# Patient Record
Sex: Female | Born: 1965 | Race: White | Hispanic: No | Smoking: Current every day smoker
Health system: Southern US, Community
[De-identification: ages and names within clinical notes are randomized; demographics above are authoritative.]

## PROBLEM LIST (undated history)

## (undated) DIAGNOSIS — H9191 Unspecified hearing loss, right ear: Secondary | ICD-10-CM

## (undated) DIAGNOSIS — F32A Depression, unspecified: Secondary | ICD-10-CM

## (undated) DIAGNOSIS — F319 Bipolar disorder, unspecified: Secondary | ICD-10-CM

## (undated) DIAGNOSIS — G43909 Migraine, unspecified, not intractable, without status migrainosus: Secondary | ICD-10-CM

## (undated) DIAGNOSIS — I509 Heart failure, unspecified: Secondary | ICD-10-CM

## (undated) DIAGNOSIS — F329 Major depressive disorder, single episode, unspecified: Secondary | ICD-10-CM

## (undated) DIAGNOSIS — I10 Essential (primary) hypertension: Secondary | ICD-10-CM

## (undated) HISTORY — DX: Unspecified hearing loss, right ear: H91.91

## (undated) HISTORY — PX: SPLENECTOMY, PARTIAL: SHX787

## (undated) HISTORY — DX: Major depressive disorder, single episode, unspecified: F32.9

## (undated) HISTORY — DX: Migraine, unspecified, not intractable, without status migrainosus: G43.909

## (undated) HISTORY — DX: Depression, unspecified: F32.A

## (undated) HISTORY — PX: APPENDECTOMY: SHX54

---

## 1997-10-18 ENCOUNTER — Emergency Department (HOSPITAL_COMMUNITY): Admission: EM | Admit: 1997-10-18 | Discharge: 1997-10-18 | Payer: Self-pay | Admitting: Emergency Medicine

## 1998-03-08 ENCOUNTER — Other Ambulatory Visit: Admission: RE | Admit: 1998-03-08 | Discharge: 1998-03-08 | Payer: Self-pay | Admitting: Obstetrics and Gynecology

## 1998-04-08 ENCOUNTER — Ambulatory Visit (HOSPITAL_COMMUNITY): Admission: RE | Admit: 1998-04-08 | Discharge: 1998-04-08 | Payer: Self-pay | Admitting: Obstetrics and Gynecology

## 1998-04-19 ENCOUNTER — Other Ambulatory Visit: Admission: RE | Admit: 1998-04-19 | Discharge: 1998-04-19 | Payer: Self-pay | Admitting: Obstetrics and Gynecology

## 1998-04-24 ENCOUNTER — Ambulatory Visit (HOSPITAL_COMMUNITY): Admission: RE | Admit: 1998-04-24 | Discharge: 1998-04-24 | Payer: Self-pay | Admitting: Obstetrics and Gynecology

## 1998-04-24 ENCOUNTER — Encounter: Payer: Self-pay | Admitting: Obstetrics and Gynecology

## 1998-07-17 ENCOUNTER — Ambulatory Visit (HOSPITAL_COMMUNITY): Admission: RE | Admit: 1998-07-17 | Discharge: 1998-07-17 | Payer: Self-pay | Admitting: Obstetrics and Gynecology

## 1998-07-21 ENCOUNTER — Encounter (HOSPITAL_COMMUNITY): Admission: RE | Admit: 1998-07-21 | Discharge: 1998-09-07 | Payer: Self-pay | Admitting: Obstetrics and Gynecology

## 1998-08-08 ENCOUNTER — Encounter: Admission: RE | Admit: 1998-08-08 | Discharge: 1998-11-06 | Payer: Self-pay | Admitting: Obstetrics and Gynecology

## 1998-09-04 ENCOUNTER — Inpatient Hospital Stay (HOSPITAL_COMMUNITY): Admission: AD | Admit: 1998-09-04 | Discharge: 1998-09-04 | Payer: Self-pay | Admitting: *Deleted

## 1998-09-06 ENCOUNTER — Inpatient Hospital Stay (HOSPITAL_COMMUNITY): Admission: AD | Admit: 1998-09-06 | Discharge: 1998-09-09 | Payer: Self-pay | Admitting: Obstetrics and Gynecology

## 1998-09-13 ENCOUNTER — Encounter: Payer: Self-pay | Admitting: Obstetrics and Gynecology

## 1998-09-13 ENCOUNTER — Ambulatory Visit (HOSPITAL_COMMUNITY): Admission: RE | Admit: 1998-09-13 | Discharge: 1998-09-13 | Payer: Self-pay | Admitting: Obstetrics and Gynecology

## 1998-10-13 ENCOUNTER — Inpatient Hospital Stay (HOSPITAL_COMMUNITY): Admission: EM | Admit: 1998-10-13 | Discharge: 1998-10-17 | Payer: Self-pay | Admitting: Emergency Medicine

## 1998-10-13 ENCOUNTER — Encounter: Payer: Self-pay | Admitting: Emergency Medicine

## 1998-10-16 ENCOUNTER — Encounter: Payer: Self-pay | Admitting: Internal Medicine

## 1998-10-20 ENCOUNTER — Encounter: Admission: RE | Admit: 1998-10-20 | Discharge: 1998-10-20 | Payer: Self-pay | Admitting: Internal Medicine

## 1998-10-27 ENCOUNTER — Encounter: Admission: RE | Admit: 1998-10-27 | Discharge: 1998-10-27 | Payer: Self-pay | Admitting: Internal Medicine

## 1998-11-03 ENCOUNTER — Encounter: Admission: RE | Admit: 1998-11-03 | Discharge: 1998-11-03 | Payer: Self-pay | Admitting: Internal Medicine

## 1998-12-15 ENCOUNTER — Encounter: Admission: RE | Admit: 1998-12-15 | Discharge: 1998-12-15 | Payer: Self-pay | Admitting: Internal Medicine

## 1999-02-20 ENCOUNTER — Emergency Department (HOSPITAL_COMMUNITY): Admission: EM | Admit: 1999-02-20 | Discharge: 1999-02-20 | Payer: Self-pay | Admitting: Emergency Medicine

## 1999-02-20 ENCOUNTER — Encounter: Payer: Self-pay | Admitting: Family Medicine

## 1999-08-30 ENCOUNTER — Emergency Department (HOSPITAL_COMMUNITY): Admission: EM | Admit: 1999-08-30 | Discharge: 1999-08-31 | Payer: Self-pay | Admitting: Emergency Medicine

## 1999-08-31 ENCOUNTER — Encounter: Payer: Self-pay | Admitting: Emergency Medicine

## 1999-08-31 ENCOUNTER — Ambulatory Visit (HOSPITAL_COMMUNITY): Admission: RE | Admit: 1999-08-31 | Discharge: 1999-08-31 | Payer: Self-pay | Admitting: Emergency Medicine

## 2000-02-06 ENCOUNTER — Emergency Department (HOSPITAL_COMMUNITY): Admission: EM | Admit: 2000-02-06 | Discharge: 2000-02-07 | Payer: Self-pay | Admitting: Emergency Medicine

## 2000-02-07 ENCOUNTER — Encounter: Payer: Self-pay | Admitting: Emergency Medicine

## 2000-07-19 ENCOUNTER — Encounter: Payer: Self-pay | Admitting: Emergency Medicine

## 2000-07-19 ENCOUNTER — Emergency Department (HOSPITAL_COMMUNITY): Admission: EM | Admit: 2000-07-19 | Discharge: 2000-07-19 | Payer: Self-pay | Admitting: Emergency Medicine

## 2000-07-24 ENCOUNTER — Encounter: Payer: Self-pay | Admitting: Emergency Medicine

## 2000-07-24 ENCOUNTER — Ambulatory Visit (HOSPITAL_COMMUNITY): Admission: RE | Admit: 2000-07-24 | Discharge: 2000-07-24 | Payer: Self-pay | Admitting: Emergency Medicine

## 2001-04-12 ENCOUNTER — Emergency Department (HOSPITAL_COMMUNITY): Admission: EM | Admit: 2001-04-12 | Discharge: 2001-04-12 | Payer: Self-pay | Admitting: Emergency Medicine

## 2001-07-04 ENCOUNTER — Emergency Department (HOSPITAL_COMMUNITY): Admission: EM | Admit: 2001-07-04 | Discharge: 2001-07-04 | Payer: Self-pay | Admitting: Emergency Medicine

## 2001-12-27 ENCOUNTER — Emergency Department (HOSPITAL_COMMUNITY): Admission: EM | Admit: 2001-12-27 | Discharge: 2001-12-28 | Payer: Self-pay | Admitting: Emergency Medicine

## 2001-12-28 ENCOUNTER — Encounter: Payer: Self-pay | Admitting: Emergency Medicine

## 2003-02-02 ENCOUNTER — Other Ambulatory Visit: Admission: RE | Admit: 2003-02-02 | Discharge: 2003-02-02 | Payer: Self-pay | Admitting: Surgery

## 2005-06-10 ENCOUNTER — Emergency Department (HOSPITAL_COMMUNITY): Admission: EM | Admit: 2005-06-10 | Discharge: 2005-06-10 | Payer: Self-pay | Admitting: Family Medicine

## 2006-06-04 ENCOUNTER — Emergency Department (HOSPITAL_COMMUNITY): Admission: EM | Admit: 2006-06-04 | Discharge: 2006-06-04 | Payer: Self-pay | Admitting: Emergency Medicine

## 2006-07-15 ENCOUNTER — Emergency Department (HOSPITAL_COMMUNITY): Admission: EM | Admit: 2006-07-15 | Discharge: 2006-07-15 | Payer: Self-pay | Admitting: Emergency Medicine

## 2006-07-15 ENCOUNTER — Emergency Department (HOSPITAL_COMMUNITY): Admission: EM | Admit: 2006-07-15 | Discharge: 2006-07-15 | Payer: Self-pay | Admitting: Family Medicine

## 2006-12-06 ENCOUNTER — Emergency Department (HOSPITAL_COMMUNITY): Admission: EM | Admit: 2006-12-06 | Discharge: 2006-12-06 | Payer: Self-pay | Admitting: Emergency Medicine

## 2006-12-27 ENCOUNTER — Emergency Department (HOSPITAL_COMMUNITY): Admission: EM | Admit: 2006-12-27 | Discharge: 2006-12-27 | Payer: Self-pay | Admitting: Emergency Medicine

## 2007-01-11 ENCOUNTER — Emergency Department (HOSPITAL_COMMUNITY): Admission: EM | Admit: 2007-01-11 | Discharge: 2007-01-11 | Payer: Self-pay | Admitting: Emergency Medicine

## 2007-01-28 ENCOUNTER — Emergency Department (HOSPITAL_COMMUNITY): Admission: EM | Admit: 2007-01-28 | Discharge: 2007-01-28 | Payer: Self-pay | Admitting: Emergency Medicine

## 2007-03-12 ENCOUNTER — Emergency Department (HOSPITAL_COMMUNITY): Admission: EM | Admit: 2007-03-12 | Discharge: 2007-03-12 | Payer: Self-pay | Admitting: Emergency Medicine

## 2007-06-01 ENCOUNTER — Emergency Department (HOSPITAL_COMMUNITY): Admission: EM | Admit: 2007-06-01 | Discharge: 2007-06-01 | Payer: Self-pay | Admitting: Family Medicine

## 2008-03-07 ENCOUNTER — Ambulatory Visit: Payer: Self-pay | Admitting: Gastroenterology

## 2008-03-07 ENCOUNTER — Ambulatory Visit: Payer: Self-pay | Admitting: Pulmonary Disease

## 2008-03-07 ENCOUNTER — Inpatient Hospital Stay (HOSPITAL_COMMUNITY): Admission: EM | Admit: 2008-03-07 | Discharge: 2008-03-15 | Payer: Self-pay | Admitting: Emergency Medicine

## 2008-04-17 ENCOUNTER — Emergency Department (HOSPITAL_COMMUNITY): Admission: EM | Admit: 2008-04-17 | Discharge: 2008-04-17 | Payer: Self-pay | Admitting: Emergency Medicine

## 2009-01-25 ENCOUNTER — Encounter (INDEPENDENT_AMBULATORY_CARE_PROVIDER_SITE_OTHER): Payer: Self-pay | Admitting: General Surgery

## 2009-01-26 ENCOUNTER — Observation Stay (HOSPITAL_COMMUNITY): Admission: EM | Admit: 2009-01-26 | Discharge: 2009-01-26 | Payer: Self-pay | Admitting: Emergency Medicine

## 2009-04-19 ENCOUNTER — Emergency Department (HOSPITAL_COMMUNITY): Admission: EM | Admit: 2009-04-19 | Discharge: 2009-04-19 | Payer: Self-pay | Admitting: Emergency Medicine

## 2009-07-20 ENCOUNTER — Emergency Department (HOSPITAL_COMMUNITY): Admission: EM | Admit: 2009-07-20 | Discharge: 2009-07-20 | Payer: Self-pay | Admitting: Emergency Medicine

## 2009-10-25 ENCOUNTER — Emergency Department (HOSPITAL_COMMUNITY): Admission: EM | Admit: 2009-10-25 | Discharge: 2009-10-25 | Payer: Self-pay | Admitting: Emergency Medicine

## 2009-11-15 ENCOUNTER — Emergency Department (HOSPITAL_COMMUNITY): Admission: EM | Admit: 2009-11-15 | Discharge: 2009-11-15 | Payer: Self-pay | Admitting: Emergency Medicine

## 2010-02-13 IMAGING — US IR TRANSCATH EMBOLIZATION
1 series · 1 of 1 positions shown · non-contrast
Comparison: none

CLINICAL DATA: Spontaneous splenic rupture with splenic hematoma
and pneumoperitoneum. The patient has been treated with volume
resuscitation and blood transfusion.  Request has been made to
study the splenic artery angiographically and embolize the splenic
artery.  This has been determined to be a better route of initial
treatment rather than immediate splenectomy due to the patient's
condition.

[Series 1: ir transcath embolization · 1 of 1 slices shown]
[im 1/1]
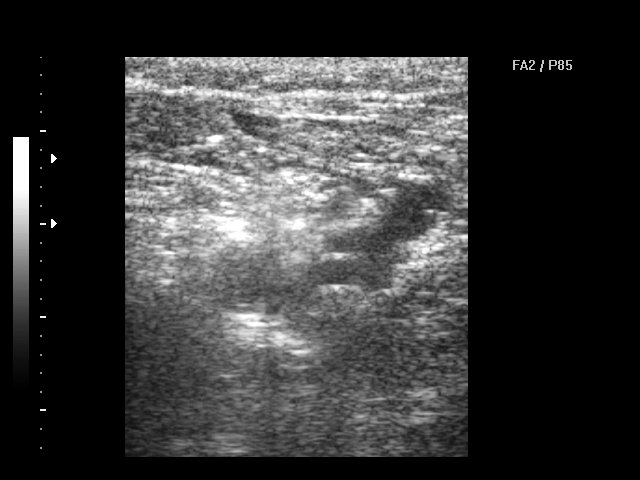

[1 of 1 positions shown; findings below may reference images not displayed]

1.  ULTRASOUND GUIDANCE FOR VASCULAR ACCESS OF THE RIGHT COMMON
FEMORAL ARTERY.
2.  VISCERAL ARTERIOGRAPHY OF THE CELIAC AXIS AND SPLENIC ARTERY.
3.  TRANSCATHETER EMBOLIZATION OF SPLENIC ARTERY.
4.  FOLLOW-UP ANGIOGRAPHY AFTER THE SPLENIC EMBOLIZATION

Sedation: 3.0 mg IV Versed; 150 mcg IV Fentanyl.

Total Moderate Sedation Time: 30 minutes.

Contrast Volume: 50 ml

Fluoroscopy Time: 5.5 minutes.

Procedure: The procedure, risks, benefits, and alternatives were
explained to the patient. Questions regarding the procedure were
encouraged and answered. The patient understands and consents to
the procedure.

The right groin was prepped with betadine in a sterile fashion, and
a sterile drape was applied covering the operative field. A sterile
gown and sterile gloves were used for the procedure. Local
anesthesia was provided with 1% Lidocaine.

The right common femoral artery was accessed under direct
ultrasound guidance with a 21 gauge needle.  Ultrasound image
documentation was performed.  After securing guide wire access, a 5-
French vascular sheath was placed.

A 5-French Cobra catheter was introduced into the abdominal aorta.
The celiac axis was catheterized and arteriography performed.  The
catheter was then further advanced over a guide wire into the
splenic artery.  Splenic arteriography was then performed.

Transcatheter embolization was then performed of the splenic artery
utilizing embolization coils.  A total of six coils were deployed
of 5 mm and 8 mm diameter.  Two 5 mm coils were initially placed
followed by a single 8 mm coil, two additional 5 mm coils and a
second 8 mm coil.

Contrast injection was performed during coil embolization.
Completion follow-up angiography was then performed through the
catheter.

After the procedure catheter and sheath were removed and hemostasis
obtained with manual compression and use of a V-pad.

Complications: None
FINDINGS: Celiac arteriography demonstrates a very tortuous splenic
artery.  Common hepatic artery is unremarkable.  Based on lack of
visualization of a right hepatic artery, the right hepatic artery
is likely replaced off of the superior mesenteric artery.

Splenic arteriography demonstrates perfusion of the entire spleen.
No active arterial contrast extravasation was visualized.  There is
no evidence of underlying splenic arteriovenous malformation,
abnormal tumor blush or pseudoaneurysm.  Given the significant
splenic hematoma present as well as hemoperitoneum, decision was
made to continue with splenic artery embolization.

The splenic artery was successfully occluded with embolization
coils.  No flow was noted in the main trunk after embolization.
Some short gastric collateral veins are present towards the region
of the spleen after embolization.
IMPRESSION: Splenic arteriography demonstrates no active arterial contrast
extravasation or underlying vascular abnormality.  Based on the
significant hematoma and hemoperitoneum present as well as initial
unstable hypotension, the splenic artery was further treated with
transcatheter embolization.  This resulted in successful occlusion
of the main splenic artery.

## 2010-02-13 IMAGING — CR DG ABDOMEN ACUTE W/ 1V CHEST
2 series · 2 of 2 positions shown · non-contrast
Comparison: None available

CLINICAL DATA: Abdominal pain.  Syncope.

ACUTE ABDOMEN SERIES (ABDOMEN 2 VIEW & CHEST 1 VIEW)

[w abdomen decub]
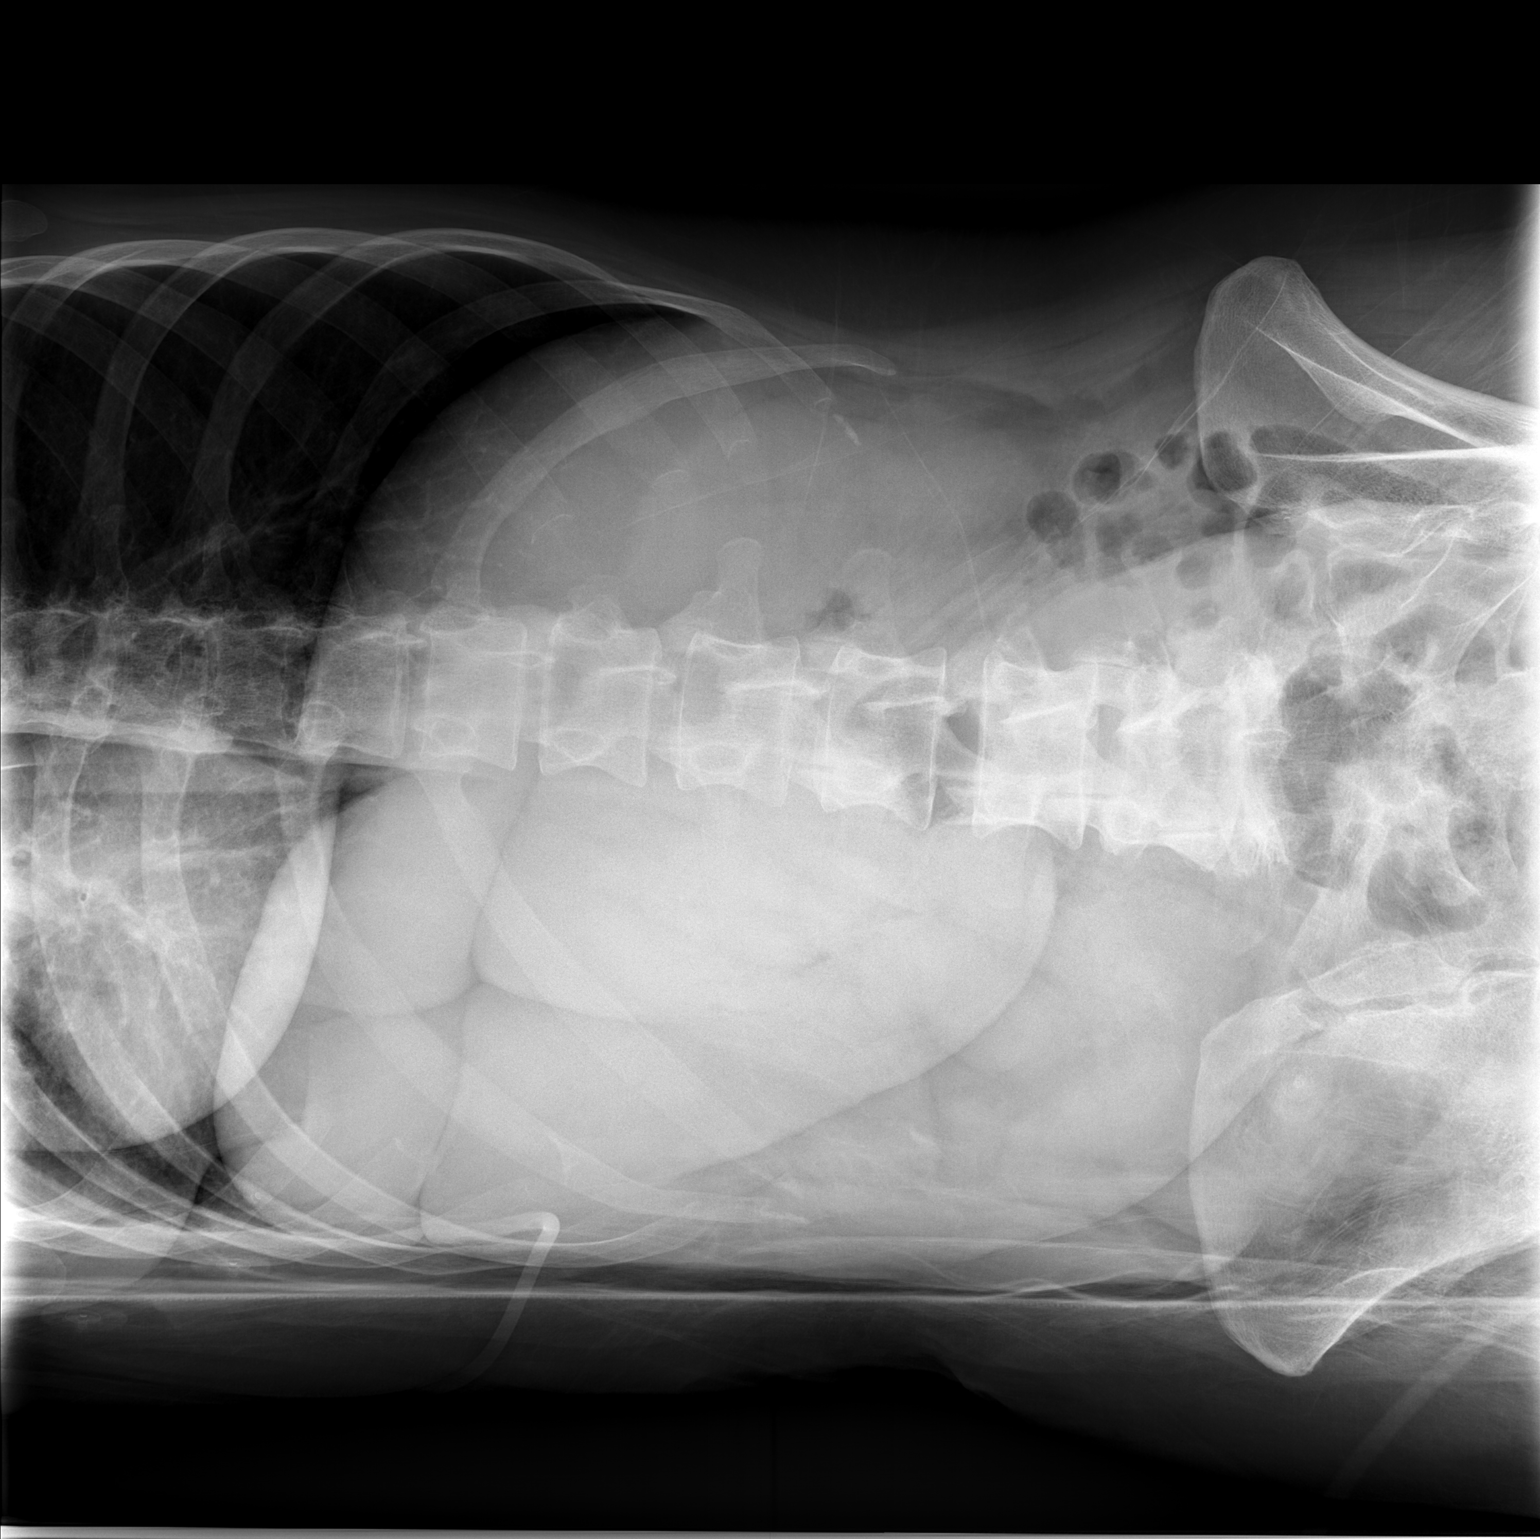

[t abdomen supine]
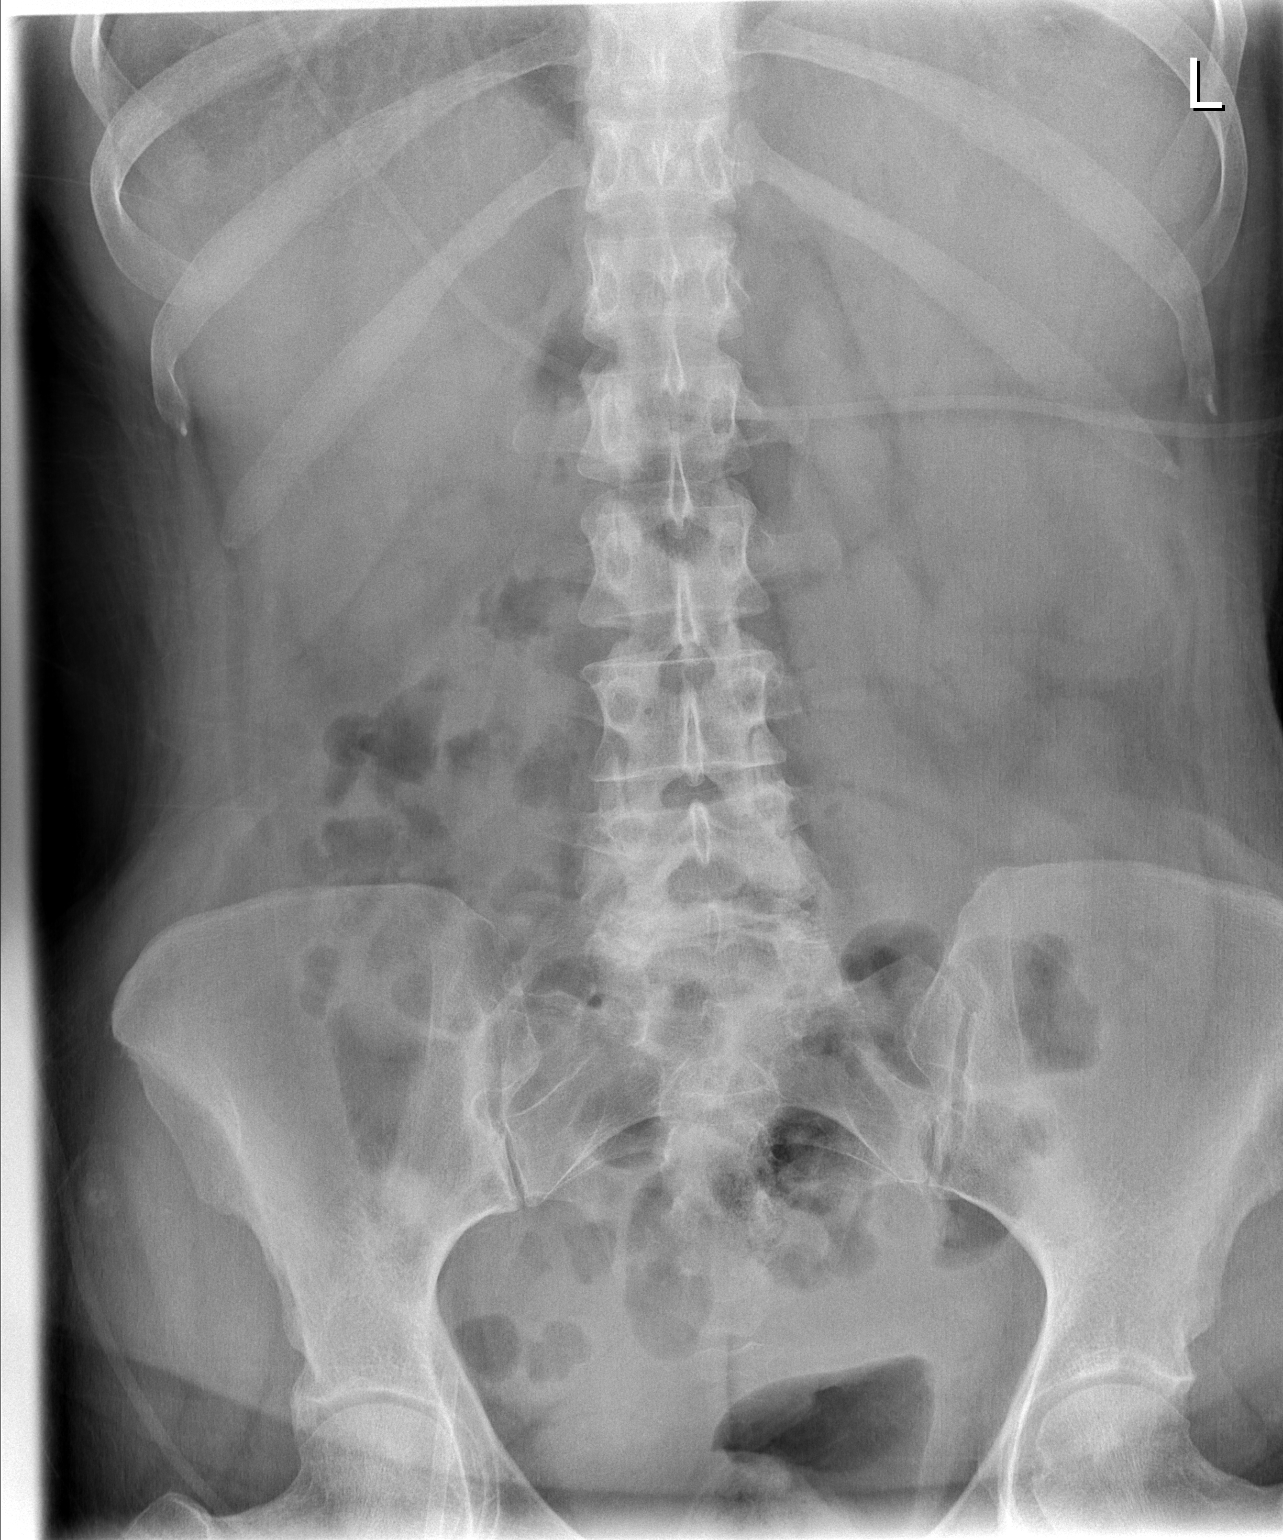

[2 of 2 positions shown; findings below may reference images not displayed]

FINDINGS: No active cardiopulmonary disease.  No free air
underneath the hemidiaphragms.  Mild thoracic scoliosis.  Tubing is
projected over the upper abdomen on the decubitus and flat
abdominal film.  Bowel gas pattern is nonobstructive.  No
pathologic air fluid levels are identified.
IMPRESSION: No acute abnormality.

## 2010-02-15 IMAGING — CT CT PELVIS W/ CM
2 of 5 series · 17 of 46 positions shown, 19 images · IV contrast (100 ML OMNI 300)
Comparison: 03/07/2008

CT ABDOMEN

CLINICAL DATA: Splenic rupture.  Worsening pain and decreased
hematocrit.  Status post splenic artery embolization.

CT ABDOMEN AND PELVIS WITH CONTRAST
TECHNIQUE: Multidetector CT imaging of the abdomen and pelvis was
performed using the standard protocol following bolus
administration of intravenous contrast.
Contrast: Moderate ccs [id number]

[Series 2: routine abdomen · axial · 0.78mm/px · z∈[-490,-50]mm · 14 of 100 slices shown, 16 images]
[im 6/100  soft-tissue]
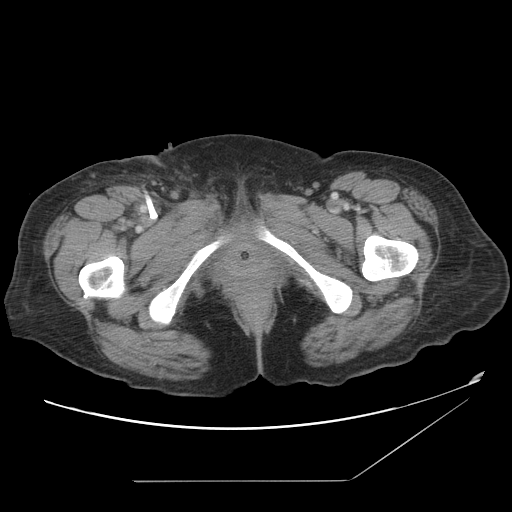
[im 6/100  bone]
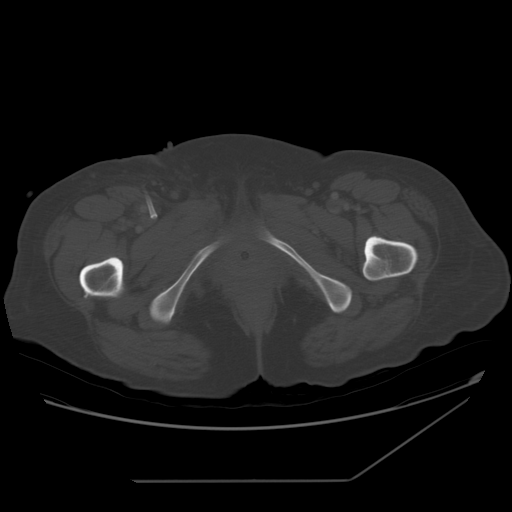
[im 11/100  soft-tissue]
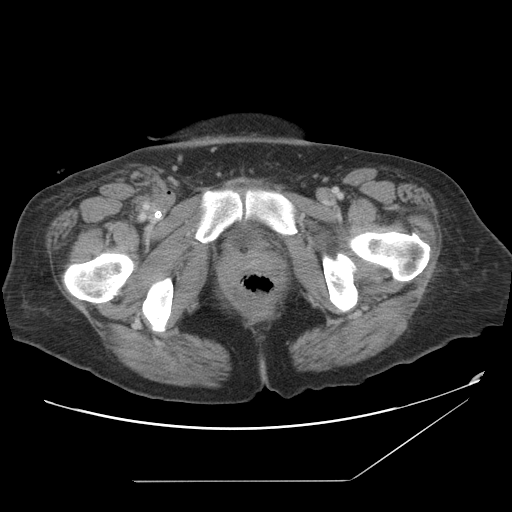
[im 21/100  soft-tissue]
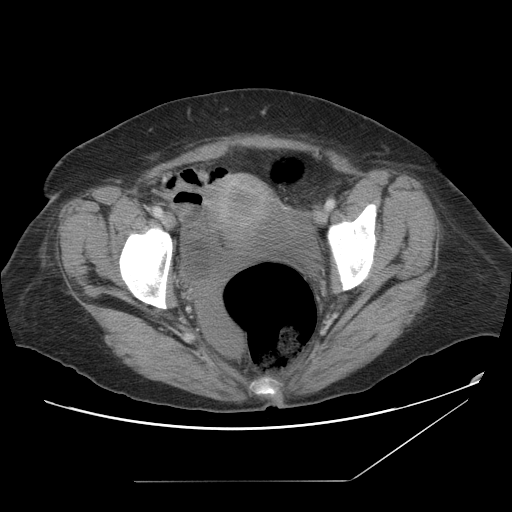
[im 27/100  soft-tissue]
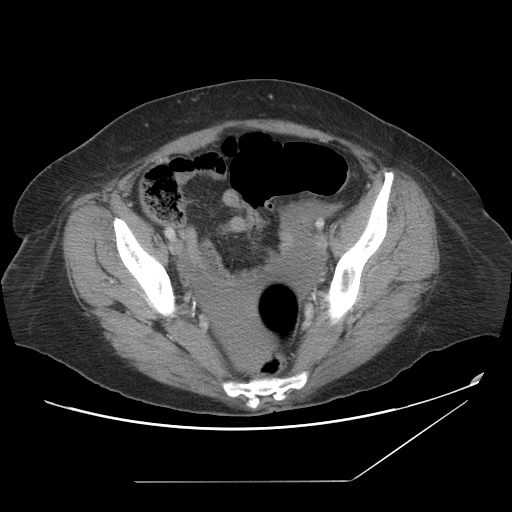
[im 32/100  soft-tissue]
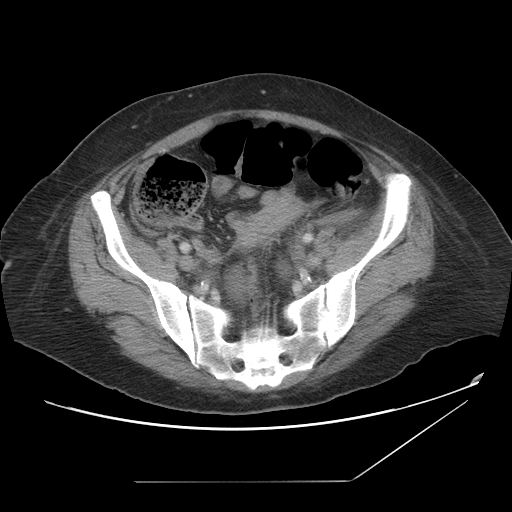
[im 42/100  soft-tissue]
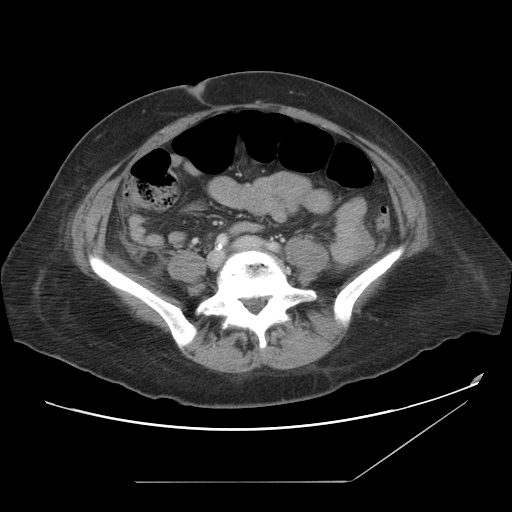
[im 47/100  soft-tissue]
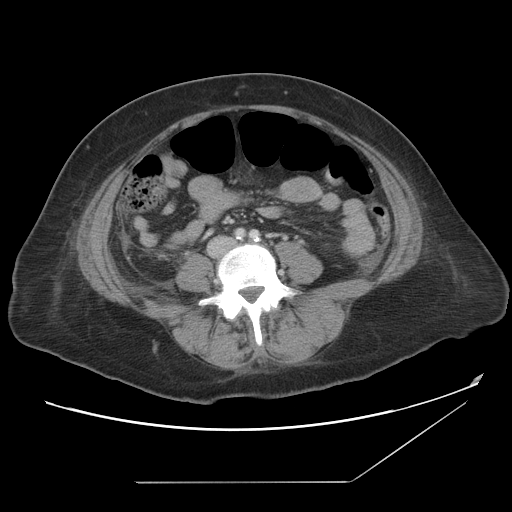
[im 53/100  soft-tissue]
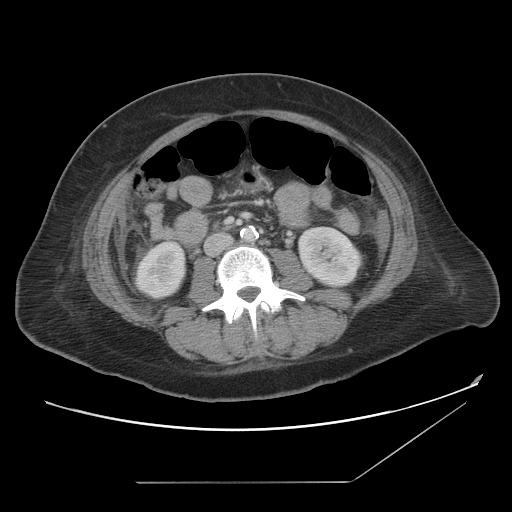
[im 58/100  soft-tissue]
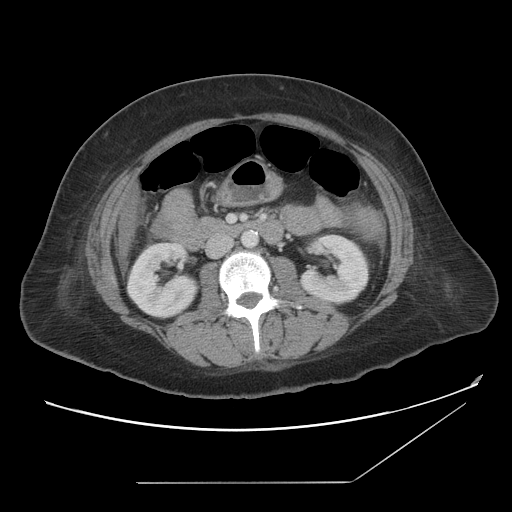
[im 58/100  bone]
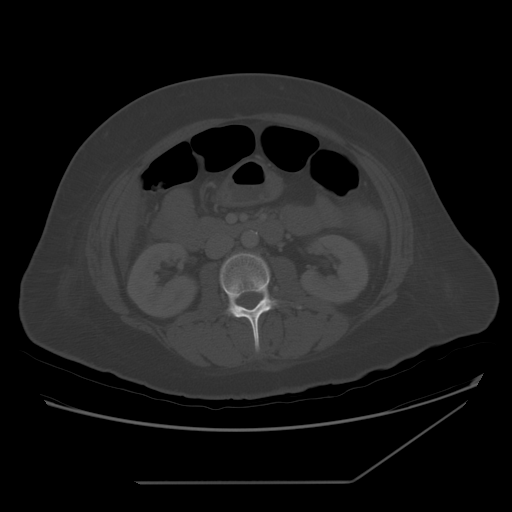
[im 68/100  soft-tissue]
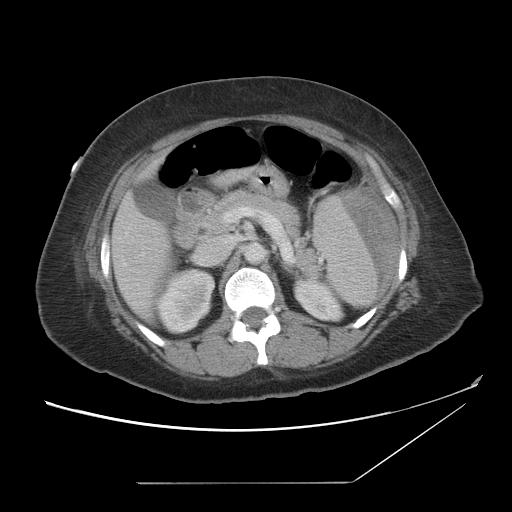
[im 73/100  soft-tissue]
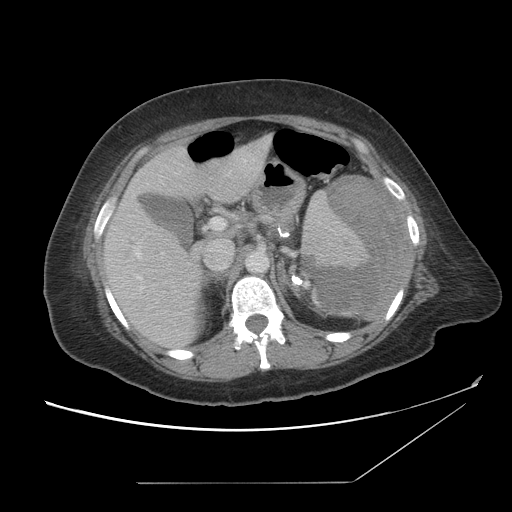
[im 79/100  soft-tissue]
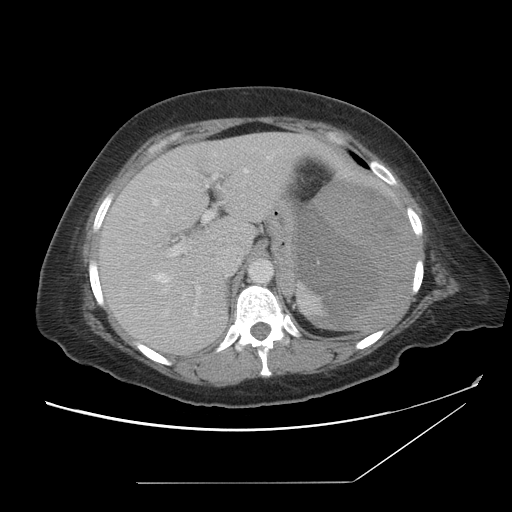
[im 89/100  soft-tissue]
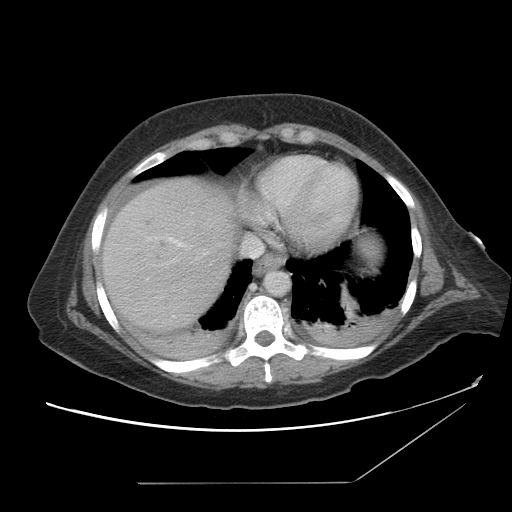
[im 94/100  soft-tissue]
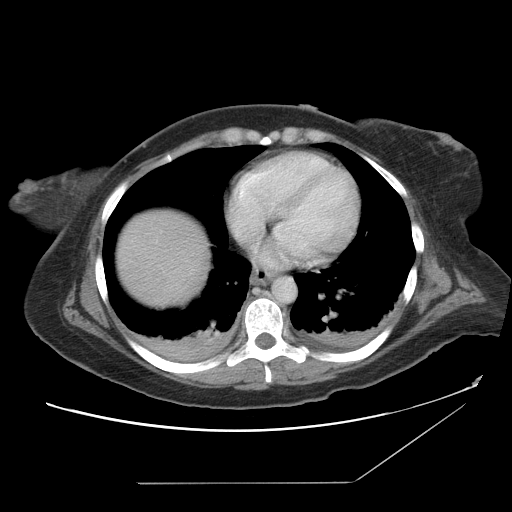

[Series 401: coronal · coronal · 0.90mm/px · 3 of 82 slices shown]
[im 28/82  soft-tissue]
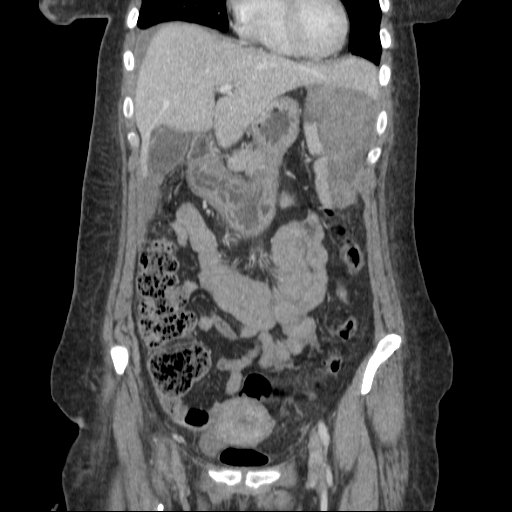
[im 37/82  soft-tissue]
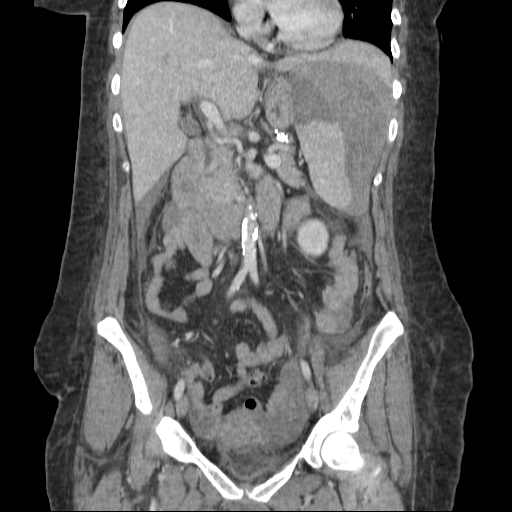
[im 46/82  soft-tissue]
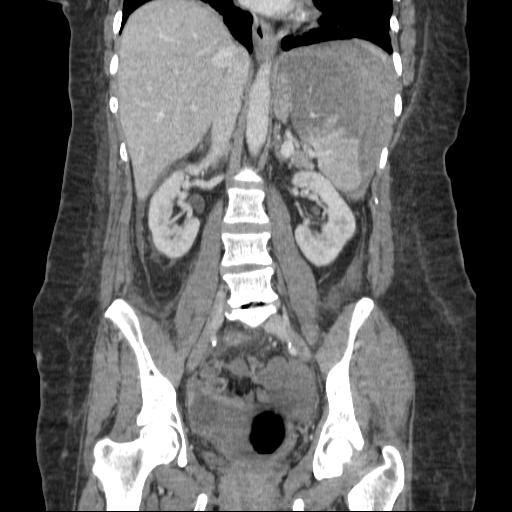

[17 of 46 positions shown; findings below may reference images not displayed]

FINDINGS: Moderate subacute perisplenic hematoma is again seen.
Infarction of the majority of the spleen is seen status post
embolization, however there is persistent perfusion/contrast
enhancement seen in the inferior aspect of the spleen, and to a
lesser extent the superior aspect.

Mild dilatation of several intrahepatic bile ducts is seen in the
anterior right lobe, without dilatation of the remainder biliary
tree.  This is of uncertain etiology.  No masses identified by CT.
The gallbladder is unremarkable appearance.  The pancreas, adrenal
glands, and kidneys are normal appearance.  There is no evidence of
hydronephrosis.

Increased bibasilar atelectasis is seen since prior exam.
IMPRESSION: 1.  Stable moderate hemoperitoneum.
2.  Partial infarction of the spleen status post embolization.
There is persistent perfusion of splenic tissue in both the
inferior and superior aspects of the spleen.
3.  Increased bibasilar atelectasis.

CT PELVIS
FINDINGS: Moderate hemoperitoneum is seen in the pelvis which is
not significant changed.  Foley catheter is seen within the bladder
which is collapsed.  Probable right ovarian cyst is seen measuring
3 cm which is likely physiologic.  No other pelvic abnormalities
seen.
IMPRESSION: Moderate pelvic hemoperitoneum, without significant change since
prior study.

## 2010-04-24 ENCOUNTER — Emergency Department (HOSPITAL_COMMUNITY)
Admission: EM | Admit: 2010-04-24 | Discharge: 2010-04-24 | Payer: Self-pay | Source: Home / Self Care | Admitting: Emergency Medicine

## 2010-07-02 LAB — POCT I-STAT, CHEM 8
BUN: 13 mg/dL (ref 6–23)
Calcium, Ion: 1.22 mmol/L (ref 1.12–1.32)
Chloride: 110 mEq/L (ref 96–112)
Creatinine, Ser: 0.7 mg/dL (ref 0.4–1.2)
Glucose, Bld: 83 mg/dL (ref 70–99)
HCT: 42 % (ref 36.0–46.0)
Hemoglobin: 14.3 g/dL (ref 12.0–15.0)
Potassium: 3.6 mEq/L (ref 3.5–5.1)
Sodium: 144 mEq/L (ref 135–145)
TCO2: 25 mmol/L (ref 0–100)

## 2010-07-02 LAB — POCT CARDIAC MARKERS
CKMB, poc: <1 ng/mL — ABNORMAL LOW (ref 1.0–8.0)
Myoglobin, poc: 67.2 ng/mL (ref 12–200)
Troponin i, poc: <0.05 ng/mL (ref 0.00–0.09)

## 2010-07-07 LAB — HEPATIC FUNCTION PANEL
ALT: 14 U/L (ref 0–35)
AST: 21 U/L (ref 0–37)
Alkaline Phosphatase: 68 U/L (ref 39–117)
Total Bilirubin: 0.5 mg/dL (ref 0.3–1.2)

## 2010-07-07 LAB — BASIC METABOLIC PANEL
Calcium: 8.9 mg/dL (ref 8.4–10.5)
Chloride: 109 mEq/L (ref 96–112)
Creatinine, Ser: 0.81 mg/dL (ref 0.4–1.2)
GFR calc Af Amer: >60 mL/min (ref >60)
Glucose, Bld: 99 mg/dL (ref 70–99)
Sodium: 141 mEq/L (ref 135–145)

## 2010-07-07 LAB — CBC
Hemoglobin: 12.4 g/dL (ref 12.0–15.0)
MCHC: 35 g/dL (ref 30.0–36.0)
MCV: 100.5 fL — ABNORMAL HIGH (ref 78.0–100.0)
Platelets: 277 10*3/uL (ref 150–400)
RDW: 12.9 % (ref 11.5–15.5)
WBC: 6.3 10*3/uL (ref 4.0–10.5)

## 2010-07-07 LAB — POCT CARDIAC MARKERS: CKMB, poc: <1 ng/mL — ABNORMAL LOW (ref 1.0–8.0)

## 2010-07-07 LAB — PREGNANCY, URINE: Preg Test, Ur: NEGATIVE

## 2010-07-07 LAB — DIFFERENTIAL
Basophils Absolute: 0 10*3/uL (ref 0.0–0.1)
Monocytes Absolute: 0.4 10*3/uL (ref 0.1–1.0)
Neutro Abs: 3.5 10*3/uL (ref 1.7–7.7)

## 2010-07-07 LAB — URINALYSIS, ROUTINE W REFLEX MICROSCOPIC
Glucose, UA: NEGATIVE mg/dL
Hgb urine dipstick: NEGATIVE
Protein, ur: NEGATIVE mg/dL
Specific Gravity, Urine: 1.014 (ref 1.005–1.030)
Urobilinogen, UA: 0.2 mg/dL (ref 0.0–1.0)
pH: 7 (ref 5.0–8.0)

## 2010-07-07 LAB — RAPID URINE DRUG SCREEN, HOSP PERFORMED: Opiates: NOT DETECTED

## 2010-07-26 LAB — COMPREHENSIVE METABOLIC PANEL
AST: 15 U/L (ref 0–37)
Albumin: 3.8 g/dL (ref 3.5–5.2)
Alkaline Phosphatase: 66 U/L (ref 39–117)
BUN: 8 mg/dL (ref 6–23)
CO2: 23 mEq/L (ref 19–32)
Creatinine, Ser: 0.76 mg/dL (ref 0.4–1.2)
GFR calc Af Amer: >60 mL/min (ref >60)
Glucose, Bld: 107 mg/dL — ABNORMAL HIGH (ref 70–99)
Potassium: 2.9 mEq/L — ABNORMAL LOW (ref 3.5–5.1)
Total Bilirubin: 0.9 mg/dL (ref 0.3–1.2)
Total Protein: 7 g/dL (ref 6.0–8.3)

## 2010-07-26 LAB — URINALYSIS, ROUTINE W REFLEX MICROSCOPIC
Nitrite: NEGATIVE
Specific Gravity, Urine: 1.02 (ref 1.005–1.030)
Urobilinogen, UA: 0.2 mg/dL (ref 0.0–1.0)

## 2010-07-26 LAB — URINE MICROSCOPIC-ADD ON

## 2010-07-26 LAB — CBC
Hemoglobin: 13.4 g/dL (ref 12.0–15.0)
MCHC: 35 g/dL (ref 30.0–36.0)
MCV: 101.4 fL — ABNORMAL HIGH (ref 78.0–100.0)
Platelets: 243 10*3/uL (ref 150–400)
RBC: 3.79 MIL/uL — ABNORMAL LOW (ref 3.87–5.11)

## 2010-07-26 LAB — DIFFERENTIAL
Lymphocytes Relative: 14 % (ref 12–46)
Monocytes Relative: 11 % (ref 3–12)
Neutrophils Relative %: 75 % (ref 43–77)

## 2010-07-26 LAB — WET PREP, GENITAL

## 2010-07-26 LAB — GC/CHLAMYDIA PROBE AMP, GENITAL
Chlamydia, DNA Probe: NEGATIVE
GC Probe Amp, Genital: NEGATIVE

## 2010-09-04 NOTE — Consult Note (Signed)
NAMEMAGUIRE, KILLMER             ACCOUNT NO.:  [id number]   MEDICAL RECORD NO.:  [id number]          PATIENT TYPE:  INP   LOCATION:  3310                         FACILITY:  [facility]   PHYSICIAN:  Juanetta Gosling, MDDATE OF BIRTH:  May 27, 1965   DATE OF CONSULTATION:  03/08/2008  DATE OF DISCHARGE:                                 CONSULTATION   HISTORY OF PRESENT ILLNESS:  Ms. Wygant is a consult from Dr. Patrica Duel  in the emergency room and then Dr. Sung Amabile from the Critical Care  Medicine Service.  She is a 45 year old female who was in her usual  state of health until earlier on March 07, 2008, when she developed  an acute onset of left upper quadrant and left lower quadrant abdominal  pain, this worsened throughout the day.  She had an episode where she  passed out at home once and has described a nausea what sounds like  coffee-ground emesis.  She also describes that she has been taking  numerous [employer] daily for a while.  She was brought to the  emergency room by her boyfriend, and she was ashen and hypotensive with  her blood pressure in the 60s to 70s upon presentation.  She received  large volume fluid resuscitation as well as O negative red blood cells  with some improvement to her systolics in the 90s.   PAST MEDICAL HISTORY:  She has,  1. Bipolar disorder.  2. Hypertension.  3. Headaches.   MEDICATIONS:  1. Lamictal.  2. Lisinopril and hydrochlorothiazide combination.  3. Valium p.r.n. 5 mg.  4. Topamax 25 b.i.d.   SOCIAL HISTORY:  She denies any trauma at home or any abuse.  She has 3  children and does have some social issues with her home life right now.  She smokes 1 pack per day, does not drink alcohol, and denies any  illicit drugs.   PHYSICAL EXAMINATION:  VITAL SIGNS:  Initial blood pressure be 78/60,  pulse of 108, and respirations 20.  She was afebrile.  GENERAL:  She is alert and oriented x3 and ashen.  ABDOMEN:  Soft, but she had a left  upper quadrant and left lower  quadrant pain that was mild without peritonitis initially.  Her acute  abdominal series showed no free air and normal bowel gas pattern and no  acute disease in her chest.  Her initial hematocrit was 32.1 and her  creatinine was 1.56.   ASSESSMENT:  Likely gastrointestinal bleed.   PLAN:  She underwent an emergent EGD by Dr. Arlyce Dice which was negative.  Following this due to her pain and her presentation, we obtained a CT  scan without contrast which showed a splenic hematoma as well as free  fluid in the pelvis.  I then consulted Dr. Irish Lack of  Interventional Radiology who coiled her spleen for the source of the  bleeding.  Plan will now be to admit her to the ICU.  Follow along her  hemoglobin and hematocrit to make sure  her coag's are corrected, platelets are normal, and hopefully she will  not necessitate a splenectomy for  this.  Also was not sure of exactly  why she ruptured her spleen, the most common etiology has been viral or  other infectious source hematologic or trauma, although this will have  to be examined further at a later date.      Juanetta Gosling, MD  Electronically Signed     MCW/MEDQ  D:  03/08/2008  T:  03/08/2008  Job:  [id number]

## 2010-09-04 NOTE — Discharge Summary (Signed)
Paula Vargas, Paula Vargas             ACCOUNT NO.:  [id number]   MEDICAL RECORD NO.:  [id number]          PATIENT TYPE:  INP   LOCATION:  5159                         FACILITY:  [facility]   PHYSICIAN:  Theodosia Paling, MD    DATE OF BIRTH:  1965-12-12   DATE OF ADMISSION:  03/07/2008  DATE OF DISCHARGE:  03/15/2008                               DISCHARGE SUMMARY   ADMITTING HISTORY:  Please refer to the consultation note dictated by  Dr. Emelia Loron under history of present illness dictated on  March 08, 2008.   PRIMARY CARE PHYSICIAN:  Dr. Clide Deutscher who practices at [facility].   DISCHARGE DIAGNOSES:  1. Splenic hematoma.  2. Sore throat.  3. History of bipolar disorder.  4. History of hypertension.   DISCHARGE MEDICATIONS:  New medications include,  1. Percocet 5/325 mg p.o. q.6 h. p.r.n.  2. Azithromycin 500 mg one day one, then 250 mg p.o. daily for 4 days.   The patient is instructed to continue her home medications which  include,  1. Topamax 25 mg p.o. q.12 h.  2. Lamictal 100 mg p.o. q.12 h.  3. Valium 5 mg p.o. q.12 h.  4. Ambien 10 mg p.o. nightly.  5. Lisinopril and hydrochlorothiazide 20/12.5 mg p.o. daily.   HOSPITAL COURSE:  Following issues were addressed during the  hospitalization.  1. Splenic hematoma.  The patient was admitted to the Step-Down      Intermediate Care Unit.  General Surgery consult was obtained.  We      closely followed the patient throughout her hospitalization.  Her      hemoglobin and vitals stayed stable.  A followup CT scan was      performed on March 13, 2008, which showed stable appearance of      spleen, status post embolization persistent contrast perfusion in      the inferior and to lesser degree superior aspect of the spleen      with very mild increase in size of the perisplenic hematoma,      increased left pleural effusion, and left lower lobe atelectasis,      slightly increase in pelvic hemoperitoneum since last  examination.      After the patient was admitted on March 08, 2008, she was taken      by the interventional radiologist.  She received blood transfusion      and volume resuscitation.  She underwent splenic artery      angiography.  Subsequently, she underwent embolization of splenic      artery.  This procedure was performed on March 08, 2008 by Dr.      Irish Lack.  Status post splenic artery embolization, the      patient was hemodynamically stable and there was no further drop in      hemoglobin and repeat CT scan was also stable.  Therefore today,      the patient is going home.  She will be following up with Dr.      Dwain Sarna in 2 weeks' time in his office for final followup.  In  the meanwhile, she will follow up with her primary care physician      with repeat CBC in 1-week time.  2. Hypertension.  Her lisinopril and hydrochlorothiazide were      continued.  3. Bipolar disorder.  Topamax and Lamictal were continued in the      hospital.   Total time was spent in discharge of this patient is 45 minutes.      Theodosia Paling, MD  Electronically Signed     NP/MEDQ  D:  03/15/2008  T:  03/16/2008  Job:  [id number]   cc:   Juanetta Gosling, MD  Dr. Verline Lema T. Fredia Sorrow, M.D.

## 2010-11-03 ENCOUNTER — Emergency Department (HOSPITAL_COMMUNITY)
Admission: EM | Admit: 2010-11-03 | Discharge: 2010-11-03 | Disposition: A | Discharge status: Home / Self Care | Payer: Medicaid Other | Attending: Emergency Medicine | Admitting: Emergency Medicine

## 2010-11-03 ENCOUNTER — Emergency Department (HOSPITAL_COMMUNITY): Payer: Medicaid Other

## 2010-11-03 DIAGNOSIS — M545 Low back pain, unspecified: Secondary | ICD-10-CM | POA: Insufficient documentation

## 2010-11-03 DIAGNOSIS — IMO0002 Reserved for concepts with insufficient information to code with codable children: Secondary | ICD-10-CM | POA: Insufficient documentation

## 2010-11-03 DIAGNOSIS — R296 Repeated falls: Secondary | ICD-10-CM | POA: Insufficient documentation

## 2010-11-03 DIAGNOSIS — I10 Essential (primary) hypertension: Secondary | ICD-10-CM | POA: Insufficient documentation

## 2010-11-03 DIAGNOSIS — F411 Generalized anxiety disorder: Secondary | ICD-10-CM | POA: Insufficient documentation

## 2010-11-03 DIAGNOSIS — Y9210 Unspecified residential institution as the place of occurrence of the external cause: Secondary | ICD-10-CM | POA: Insufficient documentation

## 2010-11-03 DIAGNOSIS — M533 Sacrococcygeal disorders, not elsewhere classified: Secondary | ICD-10-CM | POA: Insufficient documentation

## 2010-11-03 DIAGNOSIS — I509 Heart failure, unspecified: Secondary | ICD-10-CM | POA: Insufficient documentation

## 2010-11-03 DIAGNOSIS — F319 Bipolar disorder, unspecified: Secondary | ICD-10-CM | POA: Insufficient documentation

## 2010-11-03 DIAGNOSIS — M5430 Sciatica, unspecified side: Secondary | ICD-10-CM | POA: Insufficient documentation

## 2010-11-03 DIAGNOSIS — Z79899 Other long term (current) drug therapy: Secondary | ICD-10-CM | POA: Insufficient documentation

## 2010-11-03 LAB — URINALYSIS, ROUTINE W REFLEX MICROSCOPIC
Glucose, UA: NEGATIVE mg/dL
Leukocytes, UA: NEGATIVE
Nitrite: NEGATIVE
Protein, ur: NEGATIVE mg/dL

## 2010-11-03 LAB — POCT I-STAT, CHEM 8
BUN: 11 mg/dL (ref 6–23)
Chloride: 110 mEq/L (ref 96–112)
HCT: 38 % (ref 36.0–46.0)
Sodium: 143 mEq/L (ref 135–145)
TCO2: 21 mmol/L (ref 0–100)

## 2011-01-22 LAB — CBC
HCT: 23.2 — ABNORMAL LOW
HCT: 23.3 — ABNORMAL LOW
HCT: 24.4 — ABNORMAL LOW
HCT: 25.2 — ABNORMAL LOW
HCT: 26.4 — ABNORMAL LOW
HCT: 27.6 — ABNORMAL LOW
HCT: 28.5 — ABNORMAL LOW
HCT: 30.4 — ABNORMAL LOW
HCT: 32.1 — ABNORMAL LOW
Hemoglobin: 10.9 — ABNORMAL LOW
Hemoglobin: 8.1 — ABNORMAL LOW
Hemoglobin: 8.6 — ABNORMAL LOW
Hemoglobin: 9.5 — ABNORMAL LOW
MCHC: 33.4
MCHC: 34.2
MCHC: 34.7
MCHC: 34.7
MCHC: 34.9
MCHC: 35
MCV: 93.3
MCV: 93.6
MCV: 93.8
MCV: 95.1
MCV: 95.8
MCV: 96.2
MCV: 96.4
Platelets: 185
Platelets: 196
Platelets: 198
Platelets: 204
Platelets: 361
Platelets: 404 — ABNORMAL HIGH
RBC: 2.49 — ABNORMAL LOW
RBC: 2.62 — ABNORMAL LOW
RBC: 2.78 — ABNORMAL LOW
RBC: 3.01 — ABNORMAL LOW
RBC: 3.35 — ABNORMAL LOW
RDW: 14.9
RDW: 15.1
RDW: 15.1
RDW: 15.1
RDW: 15.7 — ABNORMAL HIGH
RDW: 15.8 — ABNORMAL HIGH
WBC: 11.8 — ABNORMAL HIGH
WBC: 11.9 — ABNORMAL HIGH
WBC: 4.4
WBC: 4.4
WBC: 4.8
WBC: 5.5
WBC: 7.8
WBC: 8.5
WBC: 8.6

## 2011-01-22 LAB — POCT I-STAT 4, (NA,K, GLUC, HGB,HCT)
HCT: 21 — ABNORMAL LOW
Hemoglobin: 7.1 — CL
Hemoglobin: 8.2 — ABNORMAL LOW
Potassium: 3.8
Sodium: 140
Sodium: 140
Sodium: 140

## 2011-01-22 LAB — HEMOGLOBIN AND HEMATOCRIT, BLOOD
HCT: 21 — ABNORMAL LOW
HCT: 22.7 — ABNORMAL LOW
HCT: 27.1 — ABNORMAL LOW

## 2011-01-22 LAB — COMPREHENSIVE METABOLIC PANEL
ALT: 11
Alkaline Phosphatase: 65
BUN: 17
Chloride: 105
Glucose, Bld: 209 — ABNORMAL HIGH
Potassium: 3.9
Sodium: 134 — ABNORMAL LOW
Total Bilirubin: 0.3
Total Protein: 6.1

## 2011-01-22 LAB — HCG, QUANTITATIVE, PREGNANCY: hCG, Beta Chain, Quant, S: <2

## 2011-01-22 LAB — RAPID URINE DRUG SCREEN, HOSP PERFORMED
Amphetamines: NOT DETECTED
Opiates: NOT DETECTED
Tetrahydrocannabinol: NOT DETECTED

## 2011-01-22 LAB — POCT CARDIAC MARKERS
CKMB, poc: <1 — ABNORMAL LOW
Troponin i, poc: <0.05

## 2011-01-22 LAB — BASIC METABOLIC PANEL
BUN: 11
BUN: 2 — ABNORMAL LOW
CO2: 19
CO2: 25
Chloride: 110
Chloride: 111
Chloride: 113 — ABNORMAL HIGH
Creatinine, Ser: 0.61
Creatinine, Ser: 0.78
GFR calc Af Amer: >60
GFR calc non Af Amer: >60
Glucose, Bld: 130 — ABNORMAL HIGH
Glucose, Bld: 147 — ABNORMAL HIGH
Potassium: 3.5
Potassium: 3.9
Potassium: 3.9
Sodium: 140

## 2011-01-22 LAB — TYPE AND SCREEN: Antibody Screen: NEGATIVE

## 2011-01-22 LAB — MAGNESIUM: Magnesium: 1.9

## 2011-01-22 LAB — MONONUCLEOSIS SCREEN: Mono Screen: NEGATIVE

## 2011-01-22 LAB — AMYLASE: Amylase: 75

## 2011-01-22 LAB — DIFFERENTIAL
Basophils Absolute: 0
Basophils Absolute: 0
Basophils Relative: 0
Basophils Relative: 1
Eosinophils Absolute: 0
Eosinophils Absolute: 0
Eosinophils Relative: 1
Lymphocytes Relative: 12
Lymphs Abs: 1.5
Monocytes Absolute: 1.1 — ABNORMAL HIGH
Monocytes Absolute: 1.5 — ABNORMAL HIGH
Monocytes Relative: 10
Monocytes Relative: 13 — ABNORMAL HIGH
Neutro Abs: 5.2
Neutrophils Relative %: 61
Neutrophils Relative %: 61

## 2011-01-22 LAB — OCCULT BLOOD X 1 CARD TO LAB, STOOL: Fecal Occult Bld: NEGATIVE

## 2011-01-22 LAB — URINALYSIS, ROUTINE W REFLEX MICROSCOPIC
Glucose, UA: NEGATIVE
Hgb urine dipstick: NEGATIVE
Hgb urine dipstick: NEGATIVE
Ketones, ur: 15 — AB
Ketones, ur: NEGATIVE
Nitrite: NEGATIVE
Protein, ur: NEGATIVE
Specific Gravity, Urine: 1.028
Urobilinogen, UA: 0.2
pH: 7.5

## 2011-01-22 LAB — ACETAMINOPHEN LEVEL: Acetaminophen (Tylenol), Serum: <10 — ABNORMAL LOW

## 2011-01-22 LAB — APTT: aPTT: 24

## 2011-01-22 LAB — URINE CULTURE

## 2011-01-22 LAB — PROTIME-INR
Prothrombin Time: 13.1
Prothrombin Time: 13.8

## 2011-01-22 LAB — POCT PREGNANCY, URINE: Preg Test, Ur: NEGATIVE

## 2011-01-22 LAB — LIPASE, BLOOD: Lipase: 24

## 2011-01-22 LAB — SALICYLATE LEVEL: Salicylate Lvl: <4

## 2011-01-25 LAB — DIFFERENTIAL
Basophils Absolute: 0 10*3/uL (ref 0.0–0.1)
Basophils Relative: 1 % (ref 0–1)
Eosinophils Relative: 2 % (ref 0–5)
Lymphocytes Relative: 27 % (ref 12–46)

## 2011-01-25 LAB — COMPREHENSIVE METABOLIC PANEL
AST: 18 U/L (ref 0–37)
BUN: 6 mg/dL (ref 6–23)
CO2: 25 mEq/L (ref 19–32)
Chloride: 106 mEq/L (ref 96–112)
Creatinine, Ser: 0.62 mg/dL (ref 0.4–1.2)
GFR calc Af Amer: >60 mL/min (ref >60)
GFR calc non Af Amer: >60 mL/min (ref >60)
Total Bilirubin: 0.5 mg/dL (ref 0.3–1.2)

## 2011-01-25 LAB — URINALYSIS, ROUTINE W REFLEX MICROSCOPIC
Bilirubin Urine: NEGATIVE
Glucose, UA: NEGATIVE mg/dL
Ketones, ur: NEGATIVE mg/dL
pH: 6 (ref 5.0–8.0)

## 2011-01-25 LAB — LIPASE, BLOOD: Lipase: 29 U/L (ref 11–59)

## 2011-01-25 LAB — CBC
HCT: 38 % (ref 36.0–46.0)
MCV: 95.8 fL (ref 78.0–100.0)
RBC: 3.97 MIL/uL (ref 3.87–5.11)
WBC: 9.5 10*3/uL (ref 4.0–10.5)

## 2011-01-29 LAB — CBC
HCT: 32.8 — ABNORMAL LOW
Hemoglobin: 10.8 — ABNORMAL LOW
RBC: 3.77 — ABNORMAL LOW
WBC: 8.7

## 2011-01-29 LAB — URINALYSIS, ROUTINE W REFLEX MICROSCOPIC
Bilirubin Urine: NEGATIVE
Ketones, ur: NEGATIVE
Nitrite: NEGATIVE
Protein, ur: NEGATIVE
Specific Gravity, Urine: 1.026
Urobilinogen, UA: 0.2

## 2011-01-29 LAB — DIFFERENTIAL
Basophils Absolute: 0.1
Basophils Relative: 1
Eosinophils Absolute: 0.3
Monocytes Absolute: 0.7
Neutro Abs: 5.4
Neutrophils Relative %: 62

## 2011-01-29 LAB — COMPREHENSIVE METABOLIC PANEL
ALT: 13
Alkaline Phosphatase: 56
BUN: 11
Chloride: 104
Glucose, Bld: 100 — ABNORMAL HIGH
Potassium: 3.9
Sodium: 136
Total Bilirubin: 0.6

## 2011-01-29 LAB — WET PREP, GENITAL: WBC, Wet Prep HPF POC: NONE SEEN

## 2011-01-29 LAB — RPR: RPR Ser Ql: NONREACTIVE

## 2011-01-29 LAB — GC/CHLAMYDIA PROBE AMP, GENITAL: Chlamydia, DNA Probe: NEGATIVE

## 2011-05-06 ENCOUNTER — Emergency Department (HOSPITAL_COMMUNITY): Payer: Medicaid Other

## 2011-05-06 ENCOUNTER — Emergency Department (HOSPITAL_COMMUNITY)
Admission: EM | Admit: 2011-05-06 | Discharge: 2011-05-06 | Disposition: A | Discharge status: Home / Self Care | Payer: Medicaid Other | Attending: Emergency Medicine | Admitting: Emergency Medicine

## 2011-05-06 ENCOUNTER — Encounter (HOSPITAL_COMMUNITY): Payer: Self-pay

## 2011-05-06 DIAGNOSIS — W108XXA Fall (on) (from) other stairs and steps, initial encounter: Secondary | ICD-10-CM | POA: Insufficient documentation

## 2011-05-06 DIAGNOSIS — F319 Bipolar disorder, unspecified: Secondary | ICD-10-CM | POA: Insufficient documentation

## 2011-05-06 DIAGNOSIS — IMO0002 Reserved for concepts with insufficient information to code with codable children: Secondary | ICD-10-CM | POA: Insufficient documentation

## 2011-05-06 DIAGNOSIS — S6391XA Sprain of unspecified part of right wrist and hand, initial encounter: Secondary | ICD-10-CM

## 2011-05-06 DIAGNOSIS — M79609 Pain in unspecified limb: Secondary | ICD-10-CM | POA: Insufficient documentation

## 2011-05-06 DIAGNOSIS — M7989 Other specified soft tissue disorders: Secondary | ICD-10-CM | POA: Insufficient documentation

## 2011-05-06 DIAGNOSIS — S6390XA Sprain of unspecified part of unspecified wrist and hand, initial encounter: Secondary | ICD-10-CM | POA: Insufficient documentation

## 2011-05-06 DIAGNOSIS — W19XXXA Unspecified fall, initial encounter: Secondary | ICD-10-CM

## 2011-05-06 DIAGNOSIS — S0081XA Abrasion of other part of head, initial encounter: Secondary | ICD-10-CM

## 2011-05-06 DIAGNOSIS — R51 Headache: Secondary | ICD-10-CM | POA: Insufficient documentation

## 2011-05-06 DIAGNOSIS — M25539 Pain in unspecified wrist: Secondary | ICD-10-CM | POA: Insufficient documentation

## 2011-05-06 HISTORY — DX: Bipolar disorder, unspecified: F31.9

## 2011-05-06 MED ORDER — HYDROCODONE-ACETAMINOPHEN 5-500 MG PO TABS: 1.0000 | ORAL_TABLET | Freq: Four times a day (QID) | ORAL | Status: AC | PRN

## 2011-05-06 MED ORDER — IBUPROFEN 600 MG PO TABS: 600.0000 mg | ORAL_TABLET | Freq: Four times a day (QID) | ORAL | Status: AC | PRN

## 2011-05-06 MED ORDER — OXYCODONE-ACETAMINOPHEN 5-325 MG PO TABS
1.0000 | ORAL_TABLET | Freq: Once | ORAL | Status: AC
  Administered 2011-05-06: 1 via ORAL
  Filled 2011-05-06: qty 1

## 2011-05-06 NOTE — ED Notes (Signed)
ICE PACK GIVEN FOR LT HAND

## 2011-05-06 NOTE — Progress Notes (Signed)
Orthopedic Tech Progress Note Patient Details:  Paula Vargas 06-Feb-1966 [id number]  Type of Splint: Thumb spica;Other (comment) Splint Location: applied velcro thumb spica and arm sling    Gaye Pollack 05/06/2011, 8:42 AM

## 2011-05-06 NOTE — ED Notes (Signed)
Ortho splint applied by orttho team member and  Sling for left arm.

## 2011-05-06 NOTE — ED Provider Notes (Signed)
History     CSN: [id number]  Arrival date & time 05/06/11  4098   First MD Initiated Contact with Patient 05/06/11 [phone]      Chief Complaint  Patient presents with  . Fall    (Consider location/radiation/quality/duration/timing/severity/associated sxs/prior treatment) Patient is a 46 y.o. female presenting with fall. The history is provided by the patient.  Fall The accident occurred 1 to 2 hours ago. She landed on concrete. The point of impact was the head and left wrist. The pain is present in the left wrist and head. The pain is at a severity of 6/10. The pain is mild. She was ambulatory at the scene. There was no entrapment after the fall. There was no alcohol use involved in the accident. Associated symptoms include headaches. Pertinent negatives include no visual change, no fever, no numbness and no nausea.  Pt states she was taking out trash this morning. States was going down steps, wearing flip flops. States she tripped and fell about 5 steps, hitting left side of the face and left hand/wrist. Few abrasions to the face, however denies any other headache other then the braised areas. Pain to left hand, worsened with palpation and movement.  History reviewed. No pertinent past medical history.  No past surgical history on file.  No family history on file.  History  Substance Use Topics  . Smoking status: Not on file  . Smokeless tobacco: Not on file  . Alcohol Use: Not on file    OB History    Grav Para Term Preterm Abortions TAB SAB Ect Mult Living                  Review of Systems  Constitutional: Negative for fever and chills.  HENT: Negative for nosebleeds.   Eyes: Negative.   Respiratory: Negative.   Cardiovascular: Negative.   Gastrointestinal: Negative.  Negative for nausea.  Genitourinary: Negative.   Musculoskeletal: Positive for joint swelling.  Skin: Positive for wound.  Neurological: Positive for headaches. Negative for numbness.    Psychiatric/Behavioral: Negative.     Allergies  Review of patient's allergies indicates no known allergies.  Home Medications   Current Outpatient Rx  Name Route Sig Dispense Refill  . IBUPROFEN 800 MG PO TABS Oral Take 800 mg by mouth as needed. For back pain    . LAMOTRIGINE 100 MG PO TABS Oral Take 100 mg by mouth 2 (two) times daily. Take one-half to one tablet    . TOPIRAMATE 50 MG PO TABS Oral Take 50 mg by mouth 2 (two) times daily.      Ht 5\' 6"  (1.676 m)  Wt 128 lb (58.06 kg)  BMI 20.66 kg/m2  LMP 04/14/2011  Physical Exam  Constitutional: She is oriented to person, place, and time. She appears well-developed and well-nourished.  HENT:  Head: Normocephalic.  Right Ear: External ear normal.  Left Ear: External ear normal.  Nose: Nose normal.  Mouth/Throat: Oropharynx is clear and moist.       Few abrasions to the left face, no tenderness to palpation to facial bones other then over abraised areas  Eyes: Conjunctivae are normal. Pupils are equal, round, and reactive to light.  Neck: Neck supple.  Cardiovascular: Normal rate, regular rhythm and normal heart sounds.   Pulmonary/Chest: Effort normal and breath sounds normal. No respiratory distress.  Musculoskeletal: She exhibits edema and tenderness.       Swelling and few abrasions to the left hand. Pain with wrist flexion, extension. Pain  with any finger movement.   Neurological: She is alert and oriented to person, place, and time.  Skin: Skin is warm and dry.  Psychiatric: She has a normal mood and affect.    ED Course  Procedures (including critical care time)  X-rays negative. Pt with significant swelling to left hand. Suspect sprain/contusion. Pain with movement of the fingers and thumb. Will place in velcro splint. Follow up in one week for recheck x-ray. No facial deformity/pain other then over abrasions. Normal neuro exam, no headache. Will d/c home with follow up.  Dg Wrist Complete Left  05/06/2011   *RADIOLOGY REPORT*  Clinical Data: Fall, pain, swelling.  LEFT WRIST - COMPLETE 3+ VIEW  Comparison: None.  Findings: No acute bony abnormality.  Specifically, no fracture, subluxation, or dislocation.  Soft tissues are intact.  Mild soft tissue swelling over the distal radius.  IMPRESSION: No acute bony abnormality.  Original Report Authenticated By: Cyndie Chime, M.D.   Dg Hand Complete Left  05/06/2011  *RADIOLOGY REPORT*  Clinical Data: Fall.  Hand swelling, pain.  LEFT HAND - COMPLETE 3+ VIEW  Comparison: None.  Findings: Diffuse soft tissue swelling along the dorsum of the hand.  No underlying bony abnormality.  No fracture, subluxation or dislocation.  IMPRESSION: No acute bony abnormality.  Original Report Authenticated By: Cyndie Chime, M.D.       MDM          Lottie Mussel, PA 05/06/11 [phone]

## 2011-05-06 NOTE — ED Notes (Signed)
Pt ambulatory after fall and stated only reason came was because of her hand. Pt not concerned with her face. Has superficial scrapes on face. l hand swollen  More on left thumb and index area. given pillow to elevate. Pt a&o x 4 with no LOC.

## 2011-05-06 NOTE — ED Provider Notes (Signed)
Medical screening examination/treatment/procedure(s) were performed by non-physician practitioner and as supervising physician I was immediately available for consultation/collaboration.  Flint Melter, MD 05/06/11 2039

## 2011-11-20 ENCOUNTER — Encounter (HOSPITAL_COMMUNITY): Payer: Self-pay | Admitting: *Deleted

## 2011-11-20 ENCOUNTER — Emergency Department (HOSPITAL_COMMUNITY): Payer: Medicaid Other

## 2011-11-20 ENCOUNTER — Emergency Department (HOSPITAL_COMMUNITY)
Admission: EM | Admit: 2011-11-20 | Discharge: 2011-11-21 | Disposition: A | Discharge status: Home / Self Care | Payer: Medicaid Other | Attending: Emergency Medicine | Admitting: Emergency Medicine

## 2011-11-20 DIAGNOSIS — R112 Nausea with vomiting, unspecified: Secondary | ICD-10-CM | POA: Insufficient documentation

## 2011-11-20 DIAGNOSIS — R000 Tachycardia, unspecified: Secondary | ICD-10-CM | POA: Insufficient documentation

## 2011-11-20 DIAGNOSIS — R55 Syncope and collapse: Secondary | ICD-10-CM | POA: Insufficient documentation

## 2011-11-20 DIAGNOSIS — R109 Unspecified abdominal pain: Secondary | ICD-10-CM | POA: Insufficient documentation

## 2011-11-20 DIAGNOSIS — I10 Essential (primary) hypertension: Secondary | ICD-10-CM | POA: Insufficient documentation

## 2011-11-20 DIAGNOSIS — R52 Pain, unspecified: Secondary | ICD-10-CM | POA: Insufficient documentation

## 2011-11-20 HISTORY — DX: Heart failure, unspecified: I50.9

## 2011-11-20 HISTORY — DX: Essential (primary) hypertension: I10

## 2011-11-20 LAB — URINALYSIS, ROUTINE W REFLEX MICROSCOPIC
Glucose, UA: NEGATIVE mg/dL
Hgb urine dipstick: NEGATIVE
Leukocytes, UA: NEGATIVE
Protein, ur: NEGATIVE mg/dL
Specific Gravity, Urine: 1.01 (ref 1.005–1.030)
pH: 7 (ref 5.0–8.0)

## 2011-11-20 LAB — COMPREHENSIVE METABOLIC PANEL
AST: 13 U/L (ref 0–37)
Albumin: 3.8 g/dL (ref 3.5–5.2)
Alkaline Phosphatase: 68 U/L (ref 39–117)
BUN: 12 mg/dL (ref 6–23)
CO2: 24 mEq/L (ref 19–32)
Chloride: 108 mEq/L (ref 96–112)
GFR calc non Af Amer: 84 mL/min — ABNORMAL LOW (ref >90)
Potassium: 3.6 mEq/L (ref 3.5–5.1)
Total Bilirubin: 0.2 mg/dL — ABNORMAL LOW (ref 0.3–1.2)

## 2011-11-20 LAB — CBC WITH DIFFERENTIAL/PLATELET
Basophils Absolute: 0 10*3/uL (ref 0.0–0.1)
Basophils Relative: 1 % (ref 0–1)
HCT: 40.1 % (ref 36.0–46.0)
Hemoglobin: 13.6 g/dL (ref 12.0–15.0)
Lymphocytes Relative: 48 % — ABNORMAL HIGH (ref 12–46)
MCHC: 33.9 g/dL (ref 30.0–36.0)
Monocytes Absolute: 0.6 10*3/uL (ref 0.1–1.0)
Monocytes Relative: 9 % (ref 3–12)
Neutro Abs: 2.5 10*3/uL (ref 1.7–7.7)
Neutrophils Relative %: 40 % — ABNORMAL LOW (ref 43–77)
RBC: 3.99 MIL/uL (ref 3.87–5.11)
WBC: 6.3 10*3/uL (ref 4.0–10.5)

## 2011-11-20 LAB — POCT PREGNANCY, URINE: Preg Test, Ur: NEGATIVE

## 2011-11-20 MED ORDER — SODIUM CHLORIDE 0.9 % IV BOLUS (SEPSIS)
1000.0000 mL | Freq: Once | INTRAVENOUS | Status: AC
  Administered 2011-11-20: 1000 mL via INTRAVENOUS

## 2011-11-20 MED ORDER — ONDANSETRON HCL 4 MG/2ML IJ SOLN
4.0000 mg | Freq: Once | INTRAMUSCULAR | Status: AC
  Administered 2011-11-20: 4 mg via INTRAVENOUS
  Filled 2011-11-20: qty 2

## 2011-11-20 MED ORDER — MORPHINE SULFATE 4 MG/ML IJ SOLN
4.0000 mg | Freq: Once | INTRAMUSCULAR | Status: AC
  Administered 2011-11-20: 4 mg via INTRAVENOUS
  Filled 2011-11-20: qty 1

## 2011-11-20 MED ORDER — ONDANSETRON HCL 4 MG/2ML IJ SOLN
INTRAMUSCULAR | Status: AC
  Administered 2011-11-20: 18:00:00
  Filled 2011-11-20: qty 2

## 2011-11-20 NOTE — ED Provider Notes (Signed)
History     CSN: [id number]  Arrival date & time 11/20/11  1737   First MD Initiated Contact with Patient 11/20/11 1855      Chief Complaint  Patient presents with  . Abdominal Pain    (Consider location/radiation/quality/duration/timing/severity/associated sxs/prior treatment) HPI Comments: Patient presents with 3 days of right-sided abdominal pain.  She describes associated nausea and vomiting and decreased by mouth intake.  She has not had any fevers.  Her bowel movements have been normal.  She denies any dysuria or vaginal discharge.  Her last menstrual period was one and a half weeks ago.  She notes she has had a tubal ligation as well.  She's also had an appendectomy.  Patient has had 2 or 3 syncopal episodes at home upon standing.  She has no associated chest pain or shortness of breath or palpitations.  She believes she may have noted some lightheadedness and dizziness prior to the episodes.  Patient does note that she has had minimal by mouth intake over the last few days.  No history of kidney stones.  The pain is noted to be somewhat colicky in nature with waxing and waning.  She has not noted any specific inciting or relieving factors.  She has been trying to take aspirin or ibuprofen for the pain.  The history is provided by the patient. No language interpreter was used.    Past Medical History  Diagnosis Date  . Bipolar 1 disorder   . CHF (congestive heart failure)   . Hypertension     Past Surgical History  Procedure Date  . Appendectomy   . Cesarean section   . Splenectomy, partial     History reviewed. No pertinent family history.  History  Substance Use Topics  . Smoking status: Current Everyday Smoker -- 0.7 packs/day for 28 years  . Smokeless tobacco: Never Used  . Alcohol Use: Yes    OB History    Grav Para Term Preterm Abortions TAB SAB Ect Mult Living                  Review of Systems  Constitutional: Negative.  Negative for fever and chills.    HENT: Negative.   Eyes: Negative.   Respiratory: Negative.  Negative for cough and shortness of breath.   Cardiovascular: Negative.  Negative for chest pain.  Gastrointestinal: Positive for nausea, vomiting and abdominal pain. Negative for diarrhea.  Genitourinary: Negative.  Negative for dysuria and vaginal discharge.  Musculoskeletal: Negative.  Negative for back pain.  Skin: Negative.  Negative for color change and rash.  Neurological: Positive for syncope. Negative for headaches.  Hematological: Negative.  Negative for adenopathy.  Psychiatric/Behavioral: Negative.  Negative for confusion.  All other systems reviewed and are negative.    Allergies  Review of patient's allergies indicates no known allergies.  Home Medications   Current Outpatient Rx  Name Route Sig Dispense Refill  . BUPROPION HCL ER (XL) 300 MG PO TB24 Oral Take 300 mg by mouth daily.    . IBUPROFEN 800 MG PO TABS Oral Take 800 mg by mouth as needed. For back pain    . LAMOTRIGINE 100 MG PO TABS Oral Take 50-100 mg by mouth 2 (two) times daily. Take 50 mg in the am and 100 mg in the evening.    Marland Kitchen LISINOPRIL 10 MG PO TABS Oral Take 10 mg by mouth daily.    . TOPIRAMATE 50 MG PO TABS Oral Take 50 mg by mouth 2 (two)  times daily.      BP 150/96  Pulse 104  Temp 98.5 F (36.9 C) (Oral)  Resp 22  SpO2 100%  Physical Exam  Nursing note and vitals reviewed. Constitutional: She is oriented to person, place, and time. She appears well-developed and well-nourished.  Non-toxic appearance. She does not have a sickly appearance.  HENT:  Head: Normocephalic and atraumatic.  Eyes: Conjunctivae, EOM and lids are normal. Pupils are equal, round, and reactive to light. No scleral icterus.  Neck: Trachea normal and normal range of motion. Neck supple.  Cardiovascular: Regular rhythm and normal heart sounds.  Tachycardia present.   Pulmonary/Chest: Effort normal and breath sounds normal. No respiratory distress. She has  no wheezes.  Abdominal: Soft. Normal appearance. There is no tenderness. There is no rebound, no guarding and no CVA tenderness.       Soft nontender abdomen on exam  Musculoskeletal: Normal range of motion.  Neurological: She is alert and oriented to person, place, and time. She has normal strength.  Skin: Skin is warm, dry and intact. No rash noted.  Psychiatric: She has a normal mood and affect. Her behavior is normal. Judgment and thought content normal.    ED Course  Procedures (including critical care time)  Results for orders placed during the hospital encounter of 11/20/11  URINALYSIS, ROUTINE W REFLEX MICROSCOPIC      Component Value Range   Color, Urine YELLOW  YELLOW   APPearance CLOUDY (*) CLEAR   Specific Gravity, Urine 1.010  1.005 - 1.030   pH 7.0  5.0 - 8.0   Glucose, UA NEGATIVE  NEGATIVE mg/dL   Hgb urine dipstick NEGATIVE  NEGATIVE   Bilirubin Urine NEGATIVE  NEGATIVE   Ketones, ur NEGATIVE  NEGATIVE mg/dL   Protein, ur NEGATIVE  NEGATIVE mg/dL   Urobilinogen, UA 0.2  0.0 - 1.0 mg/dL   Nitrite NEGATIVE  NEGATIVE   Leukocytes, UA NEGATIVE  NEGATIVE  POCT PREGNANCY, URINE      Component Value Range   Preg Test, Ur NEGATIVE  NEGATIVE  CBC WITH DIFFERENTIAL      Component Value Range   WBC 6.3  4.0 - 10.5 K/uL   RBC 3.99  3.87 - 5.11 MIL/uL   Hemoglobin 13.6  12.0 - 15.0 g/dL   HCT 57.3  22.0 - 25.4 %   MCV 100.5 (*) 78.0 - 100.0 fL   MCH 34.1 (*) 26.0 - 34.0 pg   MCHC 33.9  30.0 - 36.0 g/dL   RDW 27.0  62.3 - 76.2 %   Platelets 267  150 - 400 K/uL   Neutrophils Relative 40 (*) 43 - 77 %   Neutro Abs 2.5  1.7 - 7.7 K/uL   Lymphocytes Relative 48 (*) 12 - 46 %   Lymphs Abs 3.0  0.7 - 4.0 K/uL   Monocytes Relative 9  3 - 12 %   Monocytes Absolute 0.6  0.1 - 1.0 K/uL   Eosinophils Relative 2  0 - 5 %   Eosinophils Absolute 0.1  0.0 - 0.7 K/uL   Basophils Relative 1  0 - 1 %   Basophils Absolute 0.0  0.0 - 0.1 K/uL  COMPREHENSIVE METABOLIC PANEL       Component Value Range   Sodium 141  135 - 145 mEq/L   Potassium 3.6  3.5 - 5.1 mEq/L   Chloride 108  96 - 112 mEq/L   CO2 24  19 - 32 mEq/L   Glucose, Bld 79  70 - 99 mg/dL   BUN 12  6 - 23 mg/dL   Creatinine, Ser 1.61  0.50 - 1.10 mg/dL   Calcium 9.2  8.4 - 09.6 mg/dL   Total Protein 6.5  6.0 - 8.3 g/dL   Albumin 3.8  3.5 - 5.2 g/dL   AST 13  0 - 37 U/L   ALT 9  0 - 35 U/L   Alkaline Phosphatase 68  39 - 117 U/L   Total Bilirubin 0.2 (*) 0.3 - 1.2 mg/dL   GFR calc non Af Amer 84 (*) >90 mL/min   GFR calc Af Amer >90  >90 mL/min  LIPASE, BLOOD      Component Value Range   Lipase 37  11 - 59 U/L   Ct Abdomen Pelvis Wo Contrast  11/20/2011  *RADIOLOGY REPORT*  Clinical Data: Right flank pain  CT ABDOMEN AND PELVIS WITHOUT CONTRAST  Technique:  Multidetector CT imaging of the abdomen and pelvis was performed following the standard protocol without intravenous contrast.  Comparison: 11/20/2011 ultrasound, 04/17/2008 CT  Findings: 8 mm pleural based nodule right lower lobe.  Mild middle lobe scarring.  Normal heart size.  No pleural or pericardial effusion.  Organ abnormality/lesion detection is limited in the absence of intravenous contrast. Within this limitation, unremarkable liver, biliary system. Lobular residual versus regenerative splenic tissue in the left upper quadrant.  There is a cystic component with peripheral calcification, in keeping with sequelae of prior infection or hematoma.  Mildly lobular adrenal glands without a dominant/measurable nodule.  Symmetric renal size.  No hydronephrosis or hydroureter. No urinary tract calculi identified.  No bowel obstruction.  No CT evidence for colitis.  Surgical suture in the right lower quadrant suggest prior appendectomy.  No free intraperitoneal air.  There is scattered atherosclerotic calcification of the aorta and its branches. No aneurysmal dilatation.  Thin-walled bladder.  Nonspecific unenhanced CT appearance to the uterus and adnexa.   No free fluid.  Multilevel degenerative changes of the imaged spine. No acute or aggressive appearing osseous lesion.  IMPRESSION: Within limitations of a noncontrast examination, no acute abnormality identified.  No hydronephrosis or urinary tract calculi.  Nodular opacity within the right lower lobe measures 8 mm.If the patient is at high risk for bronchogenic carcinoma, follow-up chest CT at 3-6 months is recommended.  If the patient is at low risk for bronchogenic carcinoma, follow-up chest CT at 6-12 months is recommended.  This recommendation follows the consensus statement: Guidelines for Management of Small Pulmonary Nodules Detected on CT Scans: A Statement from the Fleischner Society as published in Radiology 2005; 237:395-400.  Original Report Authenticated By: Waneta Martins, M.D.   US Abdomen Complete  11/20/2011  *RADIOLOGY REPORT*  Clinical Data:  Right upper quadrant pain.  Nausea and vomiting.  COMPLETE ABDOMINAL ULTRASOUND  Comparison:  04/17/2008.  Findings:  Gallbladder:  No gallstones, gallbladder wall thickening, or pericholecystic fluid.  Common bile duct:  6 mm, within normal limits for age.  Liver:  No focal lesion identified.  Within normal limits in parenchymal echogenicity.  IVC:  Appears normal.  Pancreas:  No focal abnormality seen.  Spleen:  The previously seen splenic fluid collection has resolved. Normal echotexture of the spleen.  Spleen measures 8.7 cm.  Right Kidney:  11.1 cm. Normal echotexture.  Normal central sinus echo complex.  No calculi or hydronephrosis.  Left Kidney:  11.7 cm. Normal echotexture.  Normal central sinus echo complex.  No calculi or hydronephrosis.  Abdominal aorta:  No aneurysm identified.  IMPRESSION: Negative abdominal ultrasound.  Original Report Authenticated By: Andreas Newport, M.D.      Date: 11/20/2011  Rate: 93  Rhythm: normal sinus rhythm  QRS Axis: normal  Intervals: normal  ST/T Wave abnormalities: normal  Conduction  Disutrbances:none  Narrative Interpretation:   Old EKG Reviewed: unchanged from 03-07-08    MDM  Patient with right-sided abdominal pain of unclear etiology.  She notes that it is in the right upper quadrant and right lower quadrant.  She is artery had an appendectomy.  I consider possible cholelithiasis or cholecystitis the patient's ultrasound does not demonstrate those findings.  Patient has normal LFTs and lipase and so does not appear to have hepatitis or pancreatitis at this time.  She has no signs of urinary tract infection on her urinalysis.  Given the colicky nature of the patient's pain I considered kidney stone but her CT does not demonstrate any stone.  Patient has a soft abdomen on exam and so does not appear to have an acute surgical cause.  She is noted to have positive orthostatic vital signs but has been given IV fluids here.  I believe her syncopal episodes are likely related to dehydration given her decreased by mouth intake and her positive orthostatics here.  Patient's heart rate is improving with IV fluids as well.  Patient denies any vaginal discharge at this time.  She has a negative pregnancy test as well.  There's no signs of colitis or other intra-abdominal pathology on either her ultrasound or her CT scan at this time.  I believe the patient is safe for discharge home and have discussed this with her and her family.  I've given her precautions return for worsening pain, fevers, other GI symptoms.  I've recommended followup with both her primary care physician and a GI physician and will discharge her with pain medicines, nausea medicines and Prilosec.        Nat Christen, MD 11/21/11 [phone]

## 2011-11-20 NOTE — ED Notes (Signed)
WUJ:[id number]<JY> Expected date:<BR> Expected time: 5:38 PM<BR> Means of arrival:Ambulance<BR> Comments:<BR> 46yoF- abd pain

## 2011-11-20 NOTE — ED Notes (Signed)
Per EMS pt complaining of abdominal pain (right side) started 4 days ago, n/v, pain 10/10, appendectomy, denies urinary symptoms. 20G L wrist, 4 Zofran given, KVO. BP 160/88, 97% 2L [place], 95% RA, HR 100-120's ST.

## 2011-11-21 MED ORDER — HYDROCODONE-ACETAMINOPHEN 5-325 MG PO TABS: 1.0000 | ORAL_TABLET | ORAL | Status: AC | PRN

## 2011-11-21 MED ORDER — ONDANSETRON HCL 4 MG PO TABS: 4.0000 mg | ORAL_TABLET | Freq: Four times a day (QID) | ORAL | Status: AC

## 2011-11-21 MED ORDER — MORPHINE SULFATE 4 MG/ML IJ SOLN
4.0000 mg | Freq: Once | INTRAMUSCULAR | Status: AC
  Administered 2011-11-21: 4 mg via INTRAVENOUS
  Filled 2011-11-21: qty 1

## 2011-11-21 MED ORDER — OMEPRAZOLE 20 MG PO CPDR: 20.0000 mg | DELAYED_RELEASE_CAPSULE | Freq: Every day | ORAL | Status: DC

## 2012-01-13 ENCOUNTER — Emergency Department (HOSPITAL_COMMUNITY): Payer: Medicaid Other

## 2012-01-13 ENCOUNTER — Encounter (HOSPITAL_COMMUNITY): Payer: Self-pay | Admitting: *Deleted

## 2012-01-13 ENCOUNTER — Emergency Department (HOSPITAL_COMMUNITY)
Admission: EM | Admit: 2012-01-13 | Discharge: 2012-01-13 | Disposition: A | Discharge status: Home / Self Care | Payer: Medicaid Other | Attending: Emergency Medicine | Admitting: Emergency Medicine

## 2012-01-13 DIAGNOSIS — T148XXA Other injury of unspecified body region, initial encounter: Secondary | ICD-10-CM

## 2012-01-13 DIAGNOSIS — R42 Dizziness and giddiness: Secondary | ICD-10-CM | POA: Insufficient documentation

## 2012-01-13 DIAGNOSIS — F319 Bipolar disorder, unspecified: Secondary | ICD-10-CM | POA: Insufficient documentation

## 2012-01-13 DIAGNOSIS — F17200 Nicotine dependence, unspecified, uncomplicated: Secondary | ICD-10-CM | POA: Insufficient documentation

## 2012-01-13 DIAGNOSIS — R51 Headache: Secondary | ICD-10-CM | POA: Insufficient documentation

## 2012-01-13 DIAGNOSIS — I509 Heart failure, unspecified: Secondary | ICD-10-CM | POA: Insufficient documentation

## 2012-01-13 DIAGNOSIS — M25559 Pain in unspecified hip: Secondary | ICD-10-CM | POA: Insufficient documentation

## 2012-01-13 DIAGNOSIS — I10 Essential (primary) hypertension: Secondary | ICD-10-CM | POA: Insufficient documentation

## 2012-01-13 DIAGNOSIS — Z9089 Acquired absence of other organs: Secondary | ICD-10-CM | POA: Insufficient documentation

## 2012-01-13 MED ORDER — OXYCODONE-ACETAMINOPHEN 5-325 MG PO TABS: 1.0000 | ORAL_TABLET | Freq: Four times a day (QID) | ORAL | Status: DC | PRN

## 2012-01-13 MED ORDER — OXYCODONE-ACETAMINOPHEN 5-325 MG PO TABS
2.0000 | ORAL_TABLET | Freq: Once | ORAL | Status: AC
  Administered 2012-01-13: 2 via ORAL
  Filled 2012-01-13: qty 2

## 2012-01-13 MED ORDER — IBUPROFEN 400 MG PO TABS
600.0000 mg | ORAL_TABLET | Freq: Once | ORAL | Status: AC
  Administered 2012-01-13: 600 mg via ORAL
  Filled 2012-01-13: qty 1

## 2012-01-13 MED ORDER — IBUPROFEN 600 MG PO TABS: 600.0000 mg | ORAL_TABLET | Freq: Four times a day (QID) | ORAL | Status: DC | PRN
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
## 2012-05-04 ENCOUNTER — Emergency Department (HOSPITAL_COMMUNITY): Payer: Medicaid Other

## 2012-05-04 ENCOUNTER — Emergency Department (HOSPITAL_COMMUNITY)
Admission: EM | Admit: 2012-05-04 | Discharge: 2012-05-04 | Disposition: A | Discharge status: Home / Self Care | Payer: Medicaid Other | Attending: Emergency Medicine | Admitting: Emergency Medicine

## 2012-05-04 ENCOUNTER — Encounter (HOSPITAL_COMMUNITY): Payer: Self-pay

## 2012-05-04 DIAGNOSIS — F319 Bipolar disorder, unspecified: Secondary | ICD-10-CM | POA: Insufficient documentation

## 2012-05-04 DIAGNOSIS — I10 Essential (primary) hypertension: Secondary | ICD-10-CM | POA: Insufficient documentation

## 2012-05-04 DIAGNOSIS — Z79899 Other long term (current) drug therapy: Secondary | ICD-10-CM | POA: Insufficient documentation

## 2012-05-04 DIAGNOSIS — I509 Heart failure, unspecified: Secondary | ICD-10-CM | POA: Insufficient documentation

## 2012-05-04 DIAGNOSIS — R002 Palpitations: Secondary | ICD-10-CM | POA: Insufficient documentation

## 2012-05-04 DIAGNOSIS — F17200 Nicotine dependence, unspecified, uncomplicated: Secondary | ICD-10-CM | POA: Insufficient documentation

## 2012-05-04 DIAGNOSIS — R0602 Shortness of breath: Secondary | ICD-10-CM | POA: Insufficient documentation

## 2012-05-04 LAB — COMPREHENSIVE METABOLIC PANEL
ALT: 11 U/L (ref 0–35)
AST: 17 U/L (ref 0–37)
CO2: 22 mEq/L (ref 19–32)
Chloride: 106 mEq/L (ref 96–112)
GFR calc Af Amer: >90 mL/min (ref >90)
GFR calc non Af Amer: 85 mL/min — ABNORMAL LOW (ref >90)
Glucose, Bld: 116 mg/dL — ABNORMAL HIGH (ref 70–99)
Sodium: 142 mEq/L (ref 135–145)
Total Bilirubin: 0.3 mg/dL (ref 0.3–1.2)

## 2012-05-04 LAB — CBC WITH DIFFERENTIAL/PLATELET
Basophils Absolute: 0.1 10*3/uL (ref 0.0–0.1)
HCT: 41.2 % (ref 36.0–46.0)
Lymphocytes Relative: 27 % (ref 12–46)
Lymphs Abs: 1.8 10*3/uL (ref 0.7–4.0)
MCV: 99 fL (ref 78.0–100.0)
Monocytes Absolute: 0.4 10*3/uL (ref 0.1–1.0)
Neutro Abs: 4.4 10*3/uL (ref 1.7–7.7)
Platelets: 267 10*3/uL (ref 150–400)
RBC: 4.16 MIL/uL (ref 3.87–5.11)
RDW: 12.5 % (ref 11.5–15.5)
WBC: 6.8 10*3/uL (ref 4.0–10.5)

## 2012-05-04 MED ORDER — ACETAMINOPHEN 325 MG PO TABS
650.0000 mg | ORAL_TABLET | Freq: Once | ORAL | Status: AC
  Administered 2012-05-04: 650 mg via ORAL
  Filled 2012-05-04: qty 2

## 2012-05-04 NOTE — ED Provider Notes (Signed)
History     CSN: [id number]  Arrival date & time 05/04/12  1138   First MD Initiated Contact with Patient 05/04/12 1618      Chief Complaint  Patient presents with  . Palpitations  . Shortness of Breath    (Consider location/radiation/quality/duration/timing/severity/associated sxs/prior treatment) HPI Comments: Paula Vargas is a 47 y.o. Female who is here for evaluation of palpitations and shortness of breath. There is no associated dizziness, weakness, nausea, vomiting, or chest pain. She was unable to sleep last night, because she was worried about the discomfort. She's been taking her regular medications as usual. There are no known modifying factors. She history of congestive heart failure during pregnancy, but has had no cardiac problem since.  Patient is a 47 y.o. female presenting with palpitations and shortness of breath. The history is provided by the patient.  Palpitations  Associated symptoms include shortness of breath.  Shortness of Breath  Associated symptoms include shortness of breath.    Past Medical History  Diagnosis Date  . Bipolar 1 disorder   . CHF (congestive heart failure)   . Hypertension     Past Surgical History  Procedure Date  . Appendectomy   . Cesarean section   . Splenectomy, partial     No family history on file.  History  Substance Use Topics  . Smoking status: Current Every Day Smoker -- 0.7 packs/day for 28 years  . Smokeless tobacco: Never Used  . Alcohol Use: Yes    OB History    Grav Para Term Preterm Abortions TAB SAB Ect Mult Living                  Review of Systems  Respiratory: Positive for shortness of breath.   Cardiovascular: Positive for palpitations.  All other systems reviewed and are negative.    Allergies  Review of patient's allergies indicates no known allergies.  Home Medications   Current Outpatient Rx  Name  Route  Sig  Dispense  Refill  . BUPROPION HCL ER (XL) 300 MG PO TB24   Oral  Take 300 mg by mouth daily.         Marland Kitchen CLONAZEPAM 1 MG PO TABS   Oral   Take 1 mg by mouth 2 (two) times daily as needed. For anxiety         . LAMOTRIGINE 100 MG PO TABS   Oral   Take 50-100 mg by mouth 2 (two) times daily. Take 50 mg in the am and 100 mg in the evening.         Marland Kitchen LISINOPRIL 10 MG PO TABS   Oral   Take 10 mg by mouth daily.         . TOPIRAMATE 50 MG PO TABS   Oral   Take 50 mg by mouth 2 (two) times daily.           BP 127/88  Pulse 87  Temp 99 F (37.2 C) (Oral)  Resp 13  SpO2 95%  LMP 04/14/2011  Physical Exam  Nursing note and vitals reviewed. Constitutional: She is oriented to person, place, and time. She appears well-developed and well-nourished.  HENT:  Head: Normocephalic and atraumatic.  Eyes: Conjunctivae normal and EOM are normal. Pupils are equal, round, and reactive to light.  Neck: Normal range of motion and phonation normal. Neck supple.  Cardiovascular: Regular rhythm and intact distal pulses.        Tachycardia  Pulmonary/Chest: Effort normal and breath  sounds normal. She exhibits no tenderness.  Abdominal: Soft. She exhibits no distension. There is no tenderness. There is no guarding.  Musculoskeletal: Normal range of motion.  Neurological: She is alert and oriented to person, place, and time. She has normal strength. She exhibits normal muscle tone.  Skin: Skin is warm and dry.  Psychiatric: Her behavior is normal. Judgment and thought content normal.       Anxious    ED Course  Procedures (including critical care time)  Cardiac monitor, sinus tachycardia at 105-108- 17:30  Reevaluation- 19:50- no further complaints.   Date: 02/07/2012  Rate: 101  Rhythm: sinus tachycardia  QRS Axis: normal  PR and QT Intervals: normal  ST/T Wave abnormalities: normal  PR and QRS Conduction Disutrbances:none  Narrative Interpretation:   Old EKG Reviewed: none available   Labs Reviewed  COMPREHENSIVE METABOLIC PANEL -  Abnormal; Notable for the following:    Glucose, Bld 116 (*)     GFR calc non Af Amer 85 (*)     All other components within normal limits  CBC WITH DIFFERENTIAL  POCT I-STAT TROPONIN I  D-DIMER, QUANTITATIVE   Dg Chest 2 View  05/04/2012  *RADIOLOGY REPORT*  Clinical Data: Palpitations, shortness of breath  CHEST - 2 VIEW  Comparison: 06/04/2006  Findings: Normal heart size, mediastinal contours, and pulmonary vascularity. Minimal bronchitic changes without infiltrate, pleural effusion or pneumothorax. Bones unremarkable. Vascular coils left upper quadrant.  IMPRESSION: Minimal bronchitic changes.   Original Report Authenticated By: Ulyses Southward, M.D.    Nursing notes, applicable records and vitals reviewed.  Radiologic Images/Reports reviewed.  1. Palpitations       MDM   nonspecific palpitations, with shortness of breath. Doubt pneumonia, ACS, or occult infection. D-dimer done to rule out PE, since she is PERC positive. D-dimer is normal. Doubt PE    Plan: Home Medications- usual; Home Treatments- rest; Recommended follow up- PCP, 1-2 weeks, consider Holter monitor      Flint Melter, MD 05/05/12 [phone]

## 2012-05-04 NOTE — ED Notes (Signed)
Pt states she started having problems catching her breath since yesterday and having episodes where she feels like her heart was "fluttering". Denies any pain with the fluttering just the odd feeling.

## 2012-07-03 ENCOUNTER — Encounter: Payer: Self-pay | Admitting: Family Medicine

## 2012-07-03 DIAGNOSIS — I10 Essential (primary) hypertension: Secondary | ICD-10-CM | POA: Insufficient documentation

## 2012-07-03 DIAGNOSIS — F329 Major depressive disorder, single episode, unspecified: Secondary | ICD-10-CM | POA: Insufficient documentation

## 2012-07-03 DIAGNOSIS — H9191 Unspecified hearing loss, right ear: Secondary | ICD-10-CM | POA: Insufficient documentation

## 2012-07-03 DIAGNOSIS — G43909 Migraine, unspecified, not intractable, without status migrainosus: Secondary | ICD-10-CM | POA: Insufficient documentation

## 2012-07-17 ENCOUNTER — Encounter: Payer: Self-pay | Admitting: Physician Assistant

## 2012-07-17 ENCOUNTER — Ambulatory Visit (INDEPENDENT_AMBULATORY_CARE_PROVIDER_SITE_OTHER): Payer: Medicaid Other | Admitting: Physician Assistant

## 2012-07-17 VITALS — BP 132/90 | HR 76 | Temp 98.5°F | Resp 18 | Ht 63.0 in | Wt 146.0 lb

## 2012-07-17 DIAGNOSIS — G43909 Migraine, unspecified, not intractable, without status migrainosus: Secondary | ICD-10-CM

## 2012-07-17 DIAGNOSIS — R51 Headache: Secondary | ICD-10-CM

## 2012-07-17 MED ORDER — KETOROLAC TROMETHAMINE 60 MG/2ML IM SOLN
60.0000 mg | Freq: Once | INTRAMUSCULAR | Status: AC
  Administered 2012-07-17: 60 mg via INTRAMUSCULAR

## 2012-07-17 MED ORDER — HYDROCODONE-ACETAMINOPHEN 5-325 MG PO TABS: 1.0000 | ORAL_TABLET | Freq: Four times a day (QID) | ORAL | Status: DC | PRN

## 2012-07-17 NOTE — Progress Notes (Signed)
Patient ID: Paula Vargas MRN: [id number], DOB: 16-Feb-1966, 47 y.o. Date of Encounter: 07/17/2012, 7:34 PM    Chief Complaint:  Chief Complaint  Patient presents with  . Headache    having acute migrain ha now     HPI: 47 y.o. year old female presents with ongoing migrain headache.  Reports that this h/a has been present for 2 days. Is out of vicodin so had no med to take to treat the h/a pain.   Says she has been having migraines approx one per month. But, each one lasts 5-7 days.   Current h/a now involving entire top of head. Has photophobia, phonophobia, nausea.      Home Meds: Current Outpatient Prescriptions on File Prior to Visit  Medication Sig Dispense Refill  . buPROPion (WELLBUTRIN XL) 300 MG 24 hr tablet Take 300 mg by mouth daily.      . clonazePAM (KLONOPIN) 1 MG tablet Take 1 mg by mouth 2 (two) times daily as needed. For anxiety      . lamoTRIgine (LAMICTAL) 100 MG tablet Take 50-100 mg by mouth 2 (two) times daily. Take 50 mg in the am and 100 mg in the evening.      Marland Kitchen lisinopril (PRINIVIL,ZESTRIL) 10 MG tablet Take 10 mg by mouth daily.      Marland Kitchen HYDROcodone-acetaminophen (NORCO/VICODIN) 5-325 MG per tablet Take 1 tablet by mouth every 6 (six) hours as needed for pain (for migraine headaches).      . topiramate (TOPAMAX) 50 MG tablet Take 50 mg by mouth 2 (two) times daily.       No current facility-administered medications on file prior to visit.    Allergies: No Known Allergies    Review of Systems: Constitutional: negative for chills, fever, night sweats, weight changes, or fatigue  HEENT: negative for vision changes, hearing loss, congestion, rhinorrhea, ST, epistaxis, or sinus pressure Cardiovascular: negative for chest pain or palpitations Respiratory: negative for hemoptysis, wheezing, shortness of breath, or cough Abdominal: negative for abdominal pain, nausea, vomiting, diarrhea, or constipation Dermatological: negative for rash Neurologic:  negative for dizziness, or syncope    Physical Exam: Blood pressure 132/90, pulse 76, temperature 98.5 F (36.9 C), temperature source Oral, resp. rate 18, height 5\' 3"  (1.6 m), weight 146 lb (66.225 kg), last menstrual period 04/14/2011., Body mass index is 25.87 kg/(m^2). General: Well developed, well nourished,WF lying on exam table in the dark with lights off. Appears in mild distress. Neck: Supple. No thyromegaly. Full ROM. No lymphadenopathy. Lungs: Clear bilaterally to auscultation without wheezes, rales, or rhonchi. Breathing is unlabored. Heart: RRR with S1 S2. No murmurs, rubs, or gallops appreciated. Msk:  Strength and tone normal for age. Extremities/Skin: Warm and dry. No clubbing or cyanosis. No edema. No rashes or suspicious lesions. Neuro: Alert and oriented X 3. Moves all extremities spontaneously. Gait is normal. CNII-XII grossly in tact. Psych:  Responds to questions appropriately with a normal affect.    ASSESSMENT AND PLAN:  47 y.o. year old female with  1. Migraine headache She says she has had MRI of brain. But has never seen a specialist reg her migraines. I discussed with her that her treatment is complicated by her psych issues and psych meds. Is on Topamax by her psychiatrist. I have been avoiding triptans secondary to concern of causing serotonin syndrome given her psych meds.  She is agreeable to f/u with a specialist. I will refer.  Will give M Tramadol to help resolve current h/a and  will refill hydrocodone to use prn h/a until she sees specialist.  - HYDROcodone-acetaminophen (NORCO/VICODIN) 5-325 MG per tablet; Take 1 tablet by mouth every 6 (six) h ours as needed for pain.  Dispense: 30 tablet; Refill: 0 - Ambulatory referral to Neurology - ketorolac (TORADOL) injection 60 mg; Inject 2 mLs (60 mg total) into the muscle once.  2. Headache    Signed, [address] [place], [place], [facility] 07/17/2012 7:34 PM

## 2012-08-12 ENCOUNTER — Ambulatory Visit: Payer: Medicaid Other | Admitting: Neurology

## 2012-09-17 ENCOUNTER — Telehealth: Payer: Self-pay | Admitting: Physician Assistant

## 2012-09-17 DIAGNOSIS — G43909 Migraine, unspecified, not intractable, without status migrainosus: Secondary | ICD-10-CM

## 2012-09-17 NOTE — Telephone Encounter (Signed)
Rx given 3/28 at [facility] #30  Need approval for controlled medication.

## 2012-09-17 NOTE — Telephone Encounter (Signed)
No Show to Neurology appt 08/12/12

## 2012-09-17 NOTE — Telephone Encounter (Signed)
I reviewed LOV note 07/17/12--she was to f/u with a h/a specialist. I see no other note in epic. Has she seen Neuro or some other headache specilaist/ If so, who?

## 2012-09-18 NOTE — Telephone Encounter (Signed)
She needs to f/u specialist. I am not going to cont to Rx narcotics.

## 2012-09-24 ENCOUNTER — Encounter: Payer: Self-pay | Admitting: Family Medicine

## 2012-12-13 ENCOUNTER — Emergency Department (HOSPITAL_COMMUNITY)
Admission: EM | Admit: 2012-12-13 | Discharge: 2012-12-13 | Disposition: A | Discharge status: Home / Self Care | Payer: Medicaid Other | Attending: Emergency Medicine | Admitting: Emergency Medicine

## 2012-12-13 ENCOUNTER — Encounter (HOSPITAL_COMMUNITY): Payer: Self-pay | Admitting: Emergency Medicine

## 2012-12-13 DIAGNOSIS — Z79899 Other long term (current) drug therapy: Secondary | ICD-10-CM | POA: Insufficient documentation

## 2012-12-13 DIAGNOSIS — Z8669 Personal history of other diseases of the nervous system and sense organs: Secondary | ICD-10-CM | POA: Insufficient documentation

## 2012-12-13 DIAGNOSIS — I509 Heart failure, unspecified: Secondary | ICD-10-CM | POA: Insufficient documentation

## 2012-12-13 DIAGNOSIS — F319 Bipolar disorder, unspecified: Secondary | ICD-10-CM | POA: Insufficient documentation

## 2012-12-13 DIAGNOSIS — I10 Essential (primary) hypertension: Secondary | ICD-10-CM | POA: Insufficient documentation

## 2012-12-13 DIAGNOSIS — F17200 Nicotine dependence, unspecified, uncomplicated: Secondary | ICD-10-CM | POA: Insufficient documentation

## 2012-12-13 DIAGNOSIS — L02214 Cutaneous abscess of groin: Secondary | ICD-10-CM

## 2012-12-13 DIAGNOSIS — L02219 Cutaneous abscess of trunk, unspecified: Secondary | ICD-10-CM | POA: Insufficient documentation

## 2012-12-13 DIAGNOSIS — G43909 Migraine, unspecified, not intractable, without status migrainosus: Secondary | ICD-10-CM | POA: Insufficient documentation

## 2012-12-13 MED ORDER — SULFAMETHOXAZOLE-TRIMETHOPRIM 800-160 MG PO TABS: 1.0000 | ORAL_TABLET | Freq: Two times a day (BID) | ORAL | Status: DC

## 2012-12-13 MED ORDER — CEPHALEXIN 500 MG PO CAPS: 500.0000 mg | ORAL_CAPSULE | Freq: Two times a day (BID) | ORAL | Status: DC

## 2012-12-13 MED ORDER — TRAMADOL HCL 50 MG PO TABS: 50.0000 mg | ORAL_TABLET | Freq: Four times a day (QID) | ORAL | Status: DC | PRN

## 2012-12-13 NOTE — ED Provider Notes (Signed)
CSN: [id number]     Arrival date & time 12/13/12  1813 History  This chart was scribed for non-physician practitioner Antony Madura, PA-C working with Shanna Cisco, MD by Danella Maiers, ED Scribe. This patient was seen in room TR11C/TR11C and the patient's care was started at 6:59 PM.   Chief Complaint  Patient presents with  . Abscess   Patient is a 47 y.o. female presenting with abscess. The history is provided by the patient. No language interpreter was used.  Abscess Location:  Ano-genital Ano-genital abscess location:  Groin (right side groin) Abscess quality: painful   Red streaking: no   Duration:  4 days Progression:  Worsening Relieved by:  Nothing Ineffective treatments:  Warm compresses Associated symptoms: no fever    HPI Comments: Paula Vargas is a 47 y.o. female who presents to the Emergency Department complaining of a gradual-onset, gradually-worsening, constant abscess to the right groin associated with throbbing pain and swelling onset 4 days ago. She has tried two warm soaks with no relief. She has been taking OTC pain medication (tramadol and goodies) with minimal relief. She reports the pain is worse with ambulating. She denies hematuria, dysuria, vaginal discharge, vaginal bleeding, fever, or red streaking. Pt denies history of abscesses and MRSA.   Past Medical History  Diagnosis Date  . Bipolar 1 disorder   . CHF (congestive heart failure)   . Depression   . Deafness in right ear   . Migraine headache   . Hypertension    Past Surgical History  Procedure Laterality Date  . Appendectomy    . Cesarean section    . Splenectomy, partial     History reviewed. No pertinent family history. History  Substance Use Topics  . Smoking status: Current Every Day Smoker -- 0.50 packs/day for 28 years    Types: Cigarettes  . Smokeless tobacco: Never Used  . Alcohol Use: No   OB History   Grav Para Term Preterm Abortions TAB SAB Ect Mult Living                  Review of Systems  Constitutional: Negative for fever.  Genitourinary: Negative for dysuria, hematuria, vaginal bleeding and vaginal discharge.  Skin: Positive for rash (abscess to right groin).  All other systems reviewed and are negative.   Allergies  Review of patient's allergies indicates no known allergies.  Home Medications   Current Outpatient Rx  Name  Route  Sig  Dispense  Refill  . buPROPion (WELLBUTRIN XL) 300 MG 24 hr tablet   Oral   Take 300 mg by mouth daily.         . cephALEXin (KEFLEX) 500 MG capsule   Oral   Take 1 capsule (500 mg total) by mouth 2 (two) times daily.   14 capsule   0   . clonazePAM (KLONOPIN) 1 MG tablet   Oral   Take 1 mg by mouth 2 (two) times daily as needed. For anxiety         . HYDROcodone-acetaminophen (NORCO/VICODIN) 5-325 MG per tablet   Oral   Take 1 tablet by mouth every 6 (six) hours as needed for pain.   30 tablet   0   . lamoTRIgine (LAMICTAL) 100 MG tablet   Oral   Take 50-100 mg by mouth 2 (two) times daily. Take 50 mg in the am and 100 mg in the evening.         Marland Kitchen lisinopril (PRINIVIL,ZESTRIL) 10 MG tablet  Oral   Take 10 mg by mouth daily.         Marland Kitchen sulfamethoxazole-trimethoprim (SEPTRA DS) 800-160 MG per tablet   Oral   Take 1 tablet by mouth every 12 (twelve) hours.   14 tablet   0   . topiramate (TOPAMAX) 50 MG tablet   Oral   Take 50 mg by mouth 2 (two) times daily.         . traMADol (ULTRAM) 50 MG tablet   Oral   Take 1 tablet (50 mg total) by mouth every 6 (six) hours as needed for pain.   7 tablet   0    BP 152/94  Pulse 100  Temp(Src) 98.2 F (36.8 C) (Oral)  Resp 18  SpO2 99% Physical Exam  Nursing note and vitals reviewed. Constitutional: She is oriented to person, place, and time. She appears well-developed and well-nourished. No distress.  HENT:  Head: Normocephalic and atraumatic.  Eyes: Conjunctivae and EOM are normal. No scleral icterus.  Neck: Normal range of  motion.  Cardiovascular: Normal rate, regular rhythm and intact distal pulses.   Distal radial pulses 2+ b/l  Pulmonary/Chest: Effort normal. No respiratory distress.  Abdominal: Soft. She exhibits no distension. There is no tenderness.  Musculoskeletal: Normal range of motion.  Neurological: She is alert and oriented to person, place, and time.  Skin: Skin is warm and dry. No rash noted. She is not diaphoretic. No erythema. No pallor.  Small abscess of R groin 1cm in diameter. Mild erythema without linear streaking. +TTP. Small area of fluctuance appreciated.  Psychiatric: She has a normal mood and affect. Her behavior is normal.   ED Course  Medications - No data to display  DIAGNOSTIC STUDIES: Oxygen Saturation is 99% on room air, normal by my interpretation.    COORDINATION OF CARE: 7:15 PM- Discussed treatment plan with pt which includes I&D and treatment with antibiotics and pt agrees to plan.  Procedures (including critical care time)  INCISION AND DRAINAGE Performed by: Antony Madura, PA-C Consent: Verbal consent obtained. Risks and benefits: risks, benefits and alternatives were discussed Type: abscess  Body area: right groin  Anesthesia: local infiltration  Incision was made with a scalpel.  Local anesthetic: lidocaine 2% with epinephrine  Anesthetic total: 1 ml  Complexity: complex Blunt dissection to break up loculations  Drainage: purulent and bloody  Drainage amount: small  Packing material: none  Patient tolerance: Patient tolerated the procedure well with no immediate complications.  Labs Reviewed - No data to display No results found.  1. Abscess of groin, right    MDM  Uncomplicated abscess to R groin - Patient well and nontoxic appearing, hemodynamically stable, and afebrile. She is neurovascularly intact. Abscess I&D at bedside which patient tolerated well. Patient prepared for discharge with instruction to continue warm soaks and warm moist  compresses to promote further drainage. Given location of the abscess, will start patient on Bactrim and Keflex. Short course of tramadol given for instances of severe pain. Indications for ED return provided and patient agreeable to plan with no unaddressed concerns.  I personally performed the services described in this documentation, which was scribed in my presence. The recorded information has been reviewed and is accurate.     Antony Madura, PA-C 12/13/12 1943

## 2012-12-13 NOTE — ED Notes (Signed)
Pt reports abscess to right groin. States has tried warm soaks at home. Area swollen, painful, per pt report. Pt alert, orientedx4 NAD at present.

## 2012-12-14 MED ORDER — LIDOCAINE HCL (PF) 1 % IJ SOLN
INTRAMUSCULAR | Status: AC
  Filled 2012-12-14: qty 5

## 2012-12-14 NOTE — ED Provider Notes (Signed)
Medical screening examination/treatment/procedure(s) were performed by non-physician practitioner and as supervising physician I was immediately available for consultation/collaboration.   Ural Acree E Gearl Baratta, MD 12/14/12 0020 

## 2012-12-20 ENCOUNTER — Other Ambulatory Visit: Payer: Self-pay | Admitting: Physician Assistant

## 2012-12-22 ENCOUNTER — Encounter: Payer: Self-pay | Admitting: Family Medicine

## 2012-12-22 NOTE — Telephone Encounter (Signed)
Medication refill for one time only.  Patient needs to be seen.  Letter sent for patient to call and schedule 

## 2013-05-03 ENCOUNTER — Encounter: Payer: Self-pay | Admitting: Family Medicine

## 2013-05-03 ENCOUNTER — Other Ambulatory Visit: Payer: Self-pay | Admitting: Physician Assistant

## 2013-05-03 NOTE — Telephone Encounter (Signed)
Medication refill for one time only.  Patient needs to be seen.  Letter sent for patient to call and schedule 

## 2013-05-04 ENCOUNTER — Ambulatory Visit (INDEPENDENT_AMBULATORY_CARE_PROVIDER_SITE_OTHER): Payer: Medicaid Other | Admitting: Family Medicine

## 2013-05-04 DIAGNOSIS — Z23 Encounter for immunization: Secondary | ICD-10-CM

## 2015-01-22 ENCOUNTER — Emergency Department (HOSPITAL_COMMUNITY)
Admission: EM | Admit: 2015-01-22 | Discharge: 2015-01-22 | Disposition: A | Discharge status: Home / Self Care | Payer: Medicaid Other | Attending: Emergency Medicine | Admitting: Emergency Medicine

## 2015-01-22 ENCOUNTER — Encounter (HOSPITAL_COMMUNITY): Payer: Self-pay | Admitting: Nurse Practitioner

## 2015-01-22 ENCOUNTER — Emergency Department (HOSPITAL_COMMUNITY): Payer: Medicaid Other

## 2015-01-22 DIAGNOSIS — Z79899 Other long term (current) drug therapy: Secondary | ICD-10-CM | POA: Diagnosis not present

## 2015-01-22 DIAGNOSIS — Z720 Tobacco use: Secondary | ICD-10-CM | POA: Diagnosis not present

## 2015-01-22 DIAGNOSIS — F319 Bipolar disorder, unspecified: Secondary | ICD-10-CM | POA: Insufficient documentation

## 2015-01-22 DIAGNOSIS — I509 Heart failure, unspecified: Secondary | ICD-10-CM | POA: Insufficient documentation

## 2015-01-22 DIAGNOSIS — I10 Essential (primary) hypertension: Secondary | ICD-10-CM | POA: Diagnosis not present

## 2015-01-22 DIAGNOSIS — G43909 Migraine, unspecified, not intractable, without status migrainosus: Secondary | ICD-10-CM | POA: Diagnosis not present

## 2015-01-22 DIAGNOSIS — R0602 Shortness of breath: Secondary | ICD-10-CM | POA: Diagnosis present

## 2015-01-22 DIAGNOSIS — J40 Bronchitis, not specified as acute or chronic: Secondary | ICD-10-CM

## 2015-01-22 DIAGNOSIS — J209 Acute bronchitis, unspecified: Secondary | ICD-10-CM | POA: Insufficient documentation

## 2015-01-22 DIAGNOSIS — Z792 Long term (current) use of antibiotics: Secondary | ICD-10-CM | POA: Insufficient documentation

## 2015-01-22 LAB — BASIC METABOLIC PANEL
Anion gap: 10 (ref 5–15)
BUN: 11 mg/dL (ref 6–20)
CHLORIDE: 105 mmol/L (ref 101–111)
CO2: 25 mmol/L (ref 22–32)
CREATININE: 0.66 mg/dL (ref 0.44–1.00)
Calcium: 9.8 mg/dL (ref 8.9–10.3)
GFR calc non Af Amer: >60 mL/min (ref >60)
Glucose, Bld: 121 mg/dL — ABNORMAL HIGH (ref 65–99)
Potassium: 4 mmol/L (ref 3.5–5.1)
Sodium: 140 mmol/L (ref 135–145)

## 2015-01-22 LAB — CBC
HEMATOCRIT: 44.1 % (ref 36.0–46.0)
Hemoglobin: 14.7 g/dL (ref 12.0–15.0)
MCH: 33.8 pg (ref 26.0–34.0)
MCHC: 33.3 g/dL (ref 30.0–36.0)
MCV: 101.4 fL — AB (ref 78.0–100.0)
PLATELETS: 274 10*3/uL (ref 150–400)
RBC: 4.35 MIL/uL (ref 3.87–5.11)
RDW: 12.9 % (ref 11.5–15.5)
WBC: 8.9 10*3/uL (ref 4.0–10.5)

## 2015-01-22 LAB — I-STAT TROPONIN, ED: TROPONIN I, POC: 0.01 ng/mL (ref 0.00–0.08)

## 2015-01-22 MED ORDER — ALBUTEROL SULFATE (2.5 MG/3ML) 0.083% IN NEBU
5.0000 mg | INHALATION_SOLUTION | Freq: Once | RESPIRATORY_TRACT | Status: AC
  Administered 2015-01-22: 5 mg via RESPIRATORY_TRACT
  Filled 2015-01-22: qty 6

## 2015-01-22 MED ORDER — ALBUTEROL SULFATE HFA 108 (90 BASE) MCG/ACT IN AERS: 2.0000 | INHALATION_SPRAY | RESPIRATORY_TRACT | Status: DC | PRN

## 2015-01-22 MED ORDER — PREDNISONE 20 MG PO TABS: 40.0000 mg | ORAL_TABLET | Freq: Every day | ORAL | Status: DC

## 2015-01-22 MED ORDER — PREDNISONE 20 MG PO TABS
40.0000 mg | ORAL_TABLET | Freq: Once | ORAL | Status: AC
  Administered 2015-01-22: 40 mg via ORAL
  Filled 2015-01-22: qty 2

## 2015-01-22 MED ORDER — AZITHROMYCIN 250 MG PO TABS: 250.0000 mg | ORAL_TABLET | Freq: Every day | ORAL | Status: DC

## 2015-01-22 NOTE — ED Notes (Signed)
She reports 1 week history of nasal drainage, bilateral ear pain, cough with yellow sputum, SOB, chills. She feels more SOB with exertion and when lying down at night. She tried mucinex with no relief. A&Ox4, mild labored breathing on exertion noted

## 2015-01-22 NOTE — ED Provider Notes (Signed)
CSN: [id number]     Arrival date & time 01/22/15  1317 History   First MD Initiated Contact with Patient 01/22/15 1349     Chief Complaint  Patient presents with  . Shortness of Breath  . Cough     (Consider location/radiation/quality/duration/timing/severity/associated sxs/prior Treatment) HPI  1 week of SOB, coughing up yellow phlegm - coughing up phelgm, chills, sore throat and dry mouth - SOB worse with supine position and exertion - has tried mucinex - 15 year ppd history - no other lung problems.  No pna,  But has hx of CHF during pregnancy.  No swelling, no chest pain. Sx are persistent.  Past Medical History  Diagnosis Date  . Bipolar 1 disorder (HCC)   . CHF (congestive heart failure) (HCC)   . Depression   . Deafness in right ear   . Migraine headache   . Hypertension    Past Surgical History  Procedure Laterality Date  . Appendectomy    . Cesarean section    . Splenectomy, partial     History reviewed. No pertinent family history. Social History  Substance Use Topics  . Smoking status: Current Every Day Smoker -- 0.50 packs/day for 28 years    Types: Cigarettes  . Smokeless tobacco: Never Used  . Alcohol Use: No   OB History    No data available     Review of Systems  All other systems reviewed and are negative.     Allergies  Review of patient's allergies indicates no known allergies.  Home Medications   Prior to Admission medications   Medication Sig Start Date End Date Taking? Authorizing Provider  albuterol (PROVENTIL HFA;VENTOLIN HFA) 108 (90 BASE) MCG/ACT inhaler Inhale 2 puffs into the lungs every 4 (four) hours as needed for wheezing or shortness of breath. 01/22/15   Eber Hong, MD  azithromycin (ZITHROMAX) 250 MG tablet Take 1 tablet (250 mg total) by mouth daily. Take first 2 tablets together, then 1 every day until finished. 01/22/15   Eber Hong, MD  buPROPion (WELLBUTRIN XL) 300 MG 24 hr tablet Take 300 mg by mouth daily.     Historical Provider, MD  cephALEXin (KEFLEX) 500 MG capsule Take 1 capsule (500 mg total) by mouth 2 (two) times daily. 12/13/12   Antony Madura, PA-C  clonazePAM (KLONOPIN) 1 MG tablet Take 1 mg by mouth 2 (two) times daily as needed. For anxiety    Historical Provider, MD  HYDROcodone-acetaminophen (NORCO/VICODIN) 5-325 MG per tablet Take 1 tablet by mouth every 6 (six) hours as needed for pain. 07/17/12   Dorena Bodo, PA-C  lamoTRIgine (LAMICTAL) 100 MG tablet Take 50-100 mg by mouth 2 (two) times daily. Take 50 mg in the am and 100 mg in the evening.    Historical Provider, MD  lisinopril (PRINIVIL,ZESTRIL) 10 MG tablet TAKE 1 TABLET BY MOUTH EVERY DAY 05/03/13   Dorena Bodo, PA-C  predniSONE (DELTASONE) 20 MG tablet Take 2 tablets (40 mg total) by mouth daily. 01/22/15   Eber Hong, MD  sulfamethoxazole-trimethoprim (SEPTRA DS) 800-160 MG per tablet Take 1 tablet by mouth every 12 (twelve) hours. 12/13/12   Antony Madura, PA-C  topiramate (TOPAMAX) 50 MG tablet Take 50 mg by mouth 2 (two) times daily.    Historical Provider, MD  traMADol (ULTRAM) 50 MG tablet Take 1 tablet (50 mg total) by mouth every 6 (six) hours as needed for pain. 12/13/12   Antony Madura, PA-C   BP 133/87 mmHg  Pulse  80  Temp(Src) 98.5 F (36.9 C) (Oral)  Resp 14  Ht  (1.676 m)  Wt 145 lb 14.4 oz (66.18 kg)  BMI 23.56 kg/m2  SpO2 99% Physical Exam  Constitutional: She appears well-developed and well-nourished. No distress.  HENT:  Head: Normocephalic and atraumatic.  Mouth/Throat: Oropharynx is clear and moist. No oropharyngeal exudate.  Eyes: Conjunctivae and EOM are normal. Pupils are equal, round, and reactive to light. Right eye exhibits no discharge. Left eye exhibits no discharge. No scleral icterus.  Neck: Normal range of motion. Neck supple. No JVD present. No thyromegaly present.  Cardiovascular: Normal rate, regular rhythm, normal heart sounds and intact distal pulses.  Exam reveals no gallop and no  friction rub.   No murmur heard. Pulmonary/Chest: Effort normal. No respiratory distress. She has wheezes ( wheezing in all lung fields.  ). She has no rales.  Abdominal: Soft. Bowel sounds are normal. She exhibits no distension and no mass. There is no tenderness.  Musculoskeletal: Normal range of motion. She exhibits no edema or tenderness.  Lymphadenopathy:    She has no cervical adenopathy.  Neurological: She is alert. Coordination normal.  Skin: Skin is warm and dry. No rash noted. No erythema.  Psychiatric: She has a normal mood and affect. Her behavior is normal.  Nursing note and vitals reviewed.   ED Course  Procedures (including critical care time) Labs Review Labs Reviewed  BASIC METABOLIC PANEL - Abnormal; Notable for the following:    Glucose, Bld 121 (*)    All other components within normal limits  CBC - Abnormal; Notable for the following:    MCV 101.4 (*)    All other components within normal limits  I-STAT TROPOININ, ED    Imaging Review Dg Chest 2 View  01/22/2015   CLINICAL DATA:  Shortness of breath and cough for 1 week.  EXAM: CHEST  2 VIEW  COMPARISON:  05/04/2012  FINDINGS: The heart size and mediastinal contours are within normal limits. Both lungs are clear. The visualized skeletal structures are unremarkable.  IMPRESSION: No active cardiopulmonary disease.   Electronically Signed   By: Elige Ko   On: 01/22/2015 14:33   I have personally reviewed and evaluated these images and lab results as part of my medical decision-making.   EKG Interpretation   Date/Time:  Sunday January 22 2015 13:25:55 EDT Ventricular Rate:  96 PR Interval:  134 QRS Duration: 74 QT Interval:  352 QTC Calculation: 444 R Axis:   69 Text Interpretation:  Normal sinus rhythm Nonspecific ST abnormality  Abnormal ECG since last tracing no significant change Confirmed by Faisal Stradling   MD, Elica Almas ([id number]) on 01/22/2015 2:30:26 PM      MDM   Final diagnoses:  Bronchitis     Wheezing in all lung fields - eval for pna, vs normal - give albuterol tx and tx for bronchitis with steroids and albuterol and Z pak - pt in agreement.  Xray neg, labs normal - pt stable for d/c.  Filed Vitals:   01/22/15 1324 01/22/15 1430  BP: 139/90 133/87  Pulse: 100 80  Temp: 98.5 F (36.9 C)   TempSrc: Oral   Resp: 18 14  Height:  (1.676 m)   Weight: 145 lb 14.4 oz (66.18 kg)   SpO2: 99% 99%     Meds given in ED:  Medications  albuterol (PROVENTIL) (2.5 MG/3ML) 0.083% nebulizer solution 5 mg (5 mg Nebulization Given 01/22/15 1450)  predniSONE (DELTASONE) tablet 40 mg (40  mg Oral Given 01/22/15 1450)    New Prescriptions   ALBUTEROL (PROVENTIL HFA;VENTOLIN HFA) 108 (90 BASE) MCG/ACT INHALER    Inhale 2 puffs into the lungs every 4 (four) hours as needed for wheezing or shortness of breath.   AZITHROMYCIN (ZITHROMAX) 250 MG TABLET    Take 1 tablet (250 mg total) by mouth daily. Take first 2 tablets together, then 1 every day until finished.   PREDNISONE (DELTASONE) 20 MG TABLET    Take 2 tablets (40 mg total) by mouth daily.        Eber Hong, MD 01/22/15 [id number]

## 2015-01-22 NOTE — Discharge Instructions (Signed)

## 2015-01-22 NOTE — ED Notes (Signed)
MD at bedside. 

## 2015-09-13 ENCOUNTER — Emergency Department (HOSPITAL_COMMUNITY)
Admission: EM | Admit: 2015-09-13 | Discharge: 2015-09-13 | Disposition: A | Discharge status: Home / Self Care | Payer: Medicaid Other | Attending: Emergency Medicine | Admitting: Emergency Medicine

## 2015-09-13 ENCOUNTER — Emergency Department (HOSPITAL_COMMUNITY): Payer: Medicaid Other

## 2015-09-13 ENCOUNTER — Encounter (HOSPITAL_COMMUNITY): Payer: Self-pay | Admitting: Family Medicine

## 2015-09-13 DIAGNOSIS — I509 Heart failure, unspecified: Secondary | ICD-10-CM | POA: Diagnosis not present

## 2015-09-13 DIAGNOSIS — F17210 Nicotine dependence, cigarettes, uncomplicated: Secondary | ICD-10-CM | POA: Insufficient documentation

## 2015-09-13 DIAGNOSIS — R1013 Epigastric pain: Secondary | ICD-10-CM | POA: Diagnosis not present

## 2015-09-13 DIAGNOSIS — R0602 Shortness of breath: Secondary | ICD-10-CM | POA: Diagnosis not present

## 2015-09-13 DIAGNOSIS — Z7982 Long term (current) use of aspirin: Secondary | ICD-10-CM | POA: Insufficient documentation

## 2015-09-13 DIAGNOSIS — I110 Hypertensive heart disease with heart failure: Secondary | ICD-10-CM | POA: Insufficient documentation

## 2015-09-13 DIAGNOSIS — Z79899 Other long term (current) drug therapy: Secondary | ICD-10-CM | POA: Insufficient documentation

## 2015-09-13 DIAGNOSIS — F319 Bipolar disorder, unspecified: Secondary | ICD-10-CM | POA: Insufficient documentation

## 2015-09-13 LAB — I-STAT TROPONIN, ED: TROPONIN I, POC: 0 ng/mL (ref 0.00–0.08)

## 2015-09-13 LAB — HEPATIC FUNCTION PANEL
ALBUMIN: 4.2 g/dL (ref 3.5–5.0)
ALT: 17 U/L (ref 14–54)
AST: 24 U/L (ref 15–41)
Alkaline Phosphatase: 81 U/L (ref 38–126)
BILIRUBIN TOTAL: 0.6 mg/dL (ref 0.3–1.2)
Bilirubin, Direct: 0.2 mg/dL (ref 0.1–0.5)
Indirect Bilirubin: 0.4 mg/dL (ref 0.3–0.9)
Total Protein: 7.2 g/dL (ref 6.5–8.1)

## 2015-09-13 LAB — CBC
HEMATOCRIT: 46 % (ref 36.0–46.0)
HEMOGLOBIN: 15.3 g/dL — AB (ref 12.0–15.0)
MCH: 33 pg (ref 26.0–34.0)
MCHC: 33.3 g/dL (ref 30.0–36.0)
MCV: 99.4 fL (ref 78.0–100.0)
Platelets: 276 10*3/uL (ref 150–400)
RBC: 4.63 MIL/uL (ref 3.87–5.11)
RDW: 12.4 % (ref 11.5–15.5)
WBC: 8.7 10*3/uL (ref 4.0–10.5)

## 2015-09-13 LAB — URINALYSIS, ROUTINE W REFLEX MICROSCOPIC
BILIRUBIN URINE: NEGATIVE
GLUCOSE, UA: NEGATIVE mg/dL
KETONES UR: NEGATIVE mg/dL
LEUKOCYTES UA: NEGATIVE
Nitrite: NEGATIVE
PROTEIN: NEGATIVE mg/dL
Specific Gravity, Urine: 1.011 (ref 1.005–1.030)
pH: 5.5 (ref 5.0–8.0)

## 2015-09-13 LAB — BASIC METABOLIC PANEL
ANION GAP: 8 (ref 5–15)
BUN: 6 mg/dL (ref 6–20)
CO2: 26 mmol/L (ref 22–32)
Calcium: 9.9 mg/dL (ref 8.9–10.3)
Chloride: 106 mmol/L (ref 101–111)
Creatinine, Ser: 0.82 mg/dL (ref 0.44–1.00)
Glucose, Bld: 110 mg/dL — ABNORMAL HIGH (ref 65–99)
POTASSIUM: 3.9 mmol/L (ref 3.5–5.1)
Sodium: 140 mmol/L (ref 135–145)

## 2015-09-13 LAB — LIPASE, BLOOD: LIPASE: 25 U/L (ref 11–51)

## 2015-09-13 LAB — URINE MICROSCOPIC-ADD ON

## 2015-09-13 LAB — BRAIN NATRIURETIC PEPTIDE: B NATRIURETIC PEPTIDE 5: 106.3 pg/mL — AB (ref 0.0–100.0)

## 2015-09-13 MED ORDER — GI COCKTAIL ~~LOC~~
30.0000 mL | Freq: Once | ORAL | Status: AC
  Administered 2015-09-13: 30 mL via ORAL
  Filled 2015-09-13: qty 30

## 2015-09-13 MED ORDER — OMEPRAZOLE 20 MG PO CPDR: 20.0000 mg | DELAYED_RELEASE_CAPSULE | Freq: Every day | ORAL | Status: DC

## 2015-09-13 MED ORDER — LISINOPRIL 10 MG PO TABS: 10.0000 mg | ORAL_TABLET | Freq: Every day | ORAL | Status: AC

## 2015-09-13 NOTE — Discharge Instructions (Signed)
Community Resource Guide Financial Assistance °The [employer]’s “211” is a great source of information about community services available.  Access by dialing 2-1-1 from anywhere in [place], or by website -  www.nc211.org.  ° °Other Local Resources (Updated 04/2015) ° °Financial Assistance °  °Services ° °  °Phone Number and Address  °[facility] • Low-cost medical care - 1st and 3rd Saturday of every month °• Must not qualify for public or private insurance and must have limited income 336-350-1642 °[address] °[place], [place]  °  °[facility] • Child care °• Emergency assistance for housing and utilities °• Food stamps °• Medicaid 336-570-6532 °[address] °[place], [place] [address] °  °[facility] • Low-cost medical care for children, communicable diseases, sexually-transmitted diseases, immunizations, maternity care, women’s health and family planning [phone] °[address] °[place], [place] [address]  °[facility]  • Medication assistance for [place] residents °• Must meet income requirements 336-538-8440 °[address] [place], [place].    °[facility] • Child care °• Emergency assistance for housing and utilities °• Food stamps °• Medicaid 336-694-4141 °[address] °[place], [place] [address]  °[facility]  • Low-cost medical care,  °• Monday through Friday, 9 am to 6 pm.   Accepts Medicare/Medicaid, and self-pay 336-832-4444 °[address] °[place], [place] [address]  °[facility] • Low-cost medical care - Monday through Friday, 8:30 am - 5:30 pm °• Accepts Medicaid and self-pay [phone] °[address] [place], [place] [address]   °[facility] • Primary medical care, including for those with sickle cell disease °• Accepts Medicare,  Medicaid, insurance and self-pay [phone] °[address] °[place], [place]  °[facility]  • Primary medical care °• Accepts Medicare, Medicaid, insurance and self-pay [phone] °[address] [place], [place] [address] °  °[place] • Child care °• Emergency assistance for housing and utilities °• Food stamps °• Medicaid 336-703-3800 °[address] °[place], [place] [address]  °[facility] • Child care °• Emergency assistance for housing and utilities °• Food stamps °• Medicaid 336-641-3000 °[address] °[place], [place] [address] °  °[facility] • Medication assistance for [place] residents with no insurance only °• Must have a primary care doctor [phone] °[address] °[place], [place]  °[facility]  • Primary medical care °• Accepts Medicare, Medicaid, insurance  [phone] °[address] °[place], [place]  °[facility] ° • Medication assistance [phone]  °[facility]  • Primary medical care °• Accepts Medicare, Medicaid, insurance and self-pay 336-832-8035 °[address] [place], [place] [address]  °[facility]  • Primary medical care °• Accepts Medicare, Medicaid, insurance and self-pay 336-832-7272 °[address] , [place] [address]  °[facility] • For [place] residents between the ages of 18 and 64 who do not have any form of health insurance, Medicare, Medicaid, or VA benefits.  Services are provided free of charge to uninsured patients who fall within federal poverty guidelines.   °• Hours: Tuesdays and Thursdays, 4:15 - 8 pm [phone] °[address] °[place], [place] [address]  °[facility] ° ° ° • Primary medical care °• Dental   care °• Nutritional counseling °• Pharmacy °• Accepts Medicaid, Medicare,  most insurance. °• Fees are adjusted based on ability to pay.   [phone] °[facility] °[address] [place], [place] ° °336-570-3739 °[facility] °[address] °[place], [place] ° °336-562-3311 °[facility] °[place], [place] ° °[phone] °[facility], [address] °[place], [place] ° °[phone] °[facility] °[address] °[place], [place]  °[facility] • Women’s health and family planning 336-373-0678 °[address] °[place], [place]  ° [facility] • Child care °• Emergency assistance for housing and utilities °• Food stamps °• Medicaid 336-683-8000 °[address], °[place], [place] [address] °  °[facility]  ° • Ages 18 and older °• Hours: Mondays and Thursdays, 7:00 am - 9:00 am Patients are seen on a first come, first served basis. [phone], ext. 123 °[address] °[place], [place]  °[facility] • Child care °• Emergency assistance for housing and utilities °• Food stamps °• Medicaid 336-342-1394 °[address] °[place], [place] [address]  °[facility] • Medication assistance °• Rental assistance °• Food pantry °• Medication assistance °• Housing assistance °• Emergency food distribution °• Utility assistance 336-222-5529 °[address] °[place], [place] ° °[phone]  °[address] °[place], [place] [address] °Hours: Tuesdays and Thursdays from 9am - 12 noon by appointment only ° °[phone] °[address] °[place], [place] [address]  °[facility] ° • Accepts private insurance, Medicare, and Medicaid. °• Payment is based on a sliding scale for those without insurance. °• Hours: Mondays, Tuesdays and Thursdays, 8:30 am - 5:30 pm.   [phone] °[address] °, [place]  °[facility] - Family Medicine at  [address]  ° • Accepts private insurance, Medicare, and Medicaid. °• Payment is based on a sliding scale for those without insurance. [phone] °[address] °[place], [place]  °[facility] [address] • Accepts private insurance, Medicare, and Medicaid. °• Payment is based on a sliding scale for those without insurance [phone] °[address], [place], [place]  °[facility] [address] • Accepts private insurance, Medicare, and Medicaid. °• Payment is based on a sliding scale for those without insurance. [phone] °[address] °[place], [place]  °[facility] [address] • Accepts private insurance, Medicare, and Medicaid. °• Payment is based on a sliding scale for those without insurance. [phone], ext. [phone] °[address] °[place], [place].    °[facility] • Maternity care. °• Accepts Medicaid and self-pay. [phone] °[address] °[place],   ° ° °

## 2015-09-13 NOTE — ED Provider Notes (Signed)
CSN: [id number]     Arrival date & time 09/13/15  1149 History   First MD Initiated Contact with Patient 09/13/15 1236     Chief Complaint  Patient presents with  . Shortness of Breath  . Abdominal Pain     (Consider location/radiation/quality/duration/timing/severity/associated sxs/prior Treatment) HPI Comments: [age]year-old female with past medical history including bipolar disorder, hypertension, postpartum heart failure, migraines who presents with epigastric pain. She states that 3 days ago she began having intermittent epigastric pain that became worse overnight and has been severe and constant today. She reports occasional associated neck tightness and shortness of breath. She denies any exertional component. Nothing makes the pain better or worse. No change with eating. She denies any nausea, vomiting, diarrhea, or blood in her stool. She reports some hot and cold chills but no fevers. No recent illness. She states she has had some difficulty emptying her bladder but denies any dysuria or hematuria. She denies any frequent NSAID or alcohol use. No recent travel, history of blood clots, leg swelling, or history of cancer. No family history of heart disease.  Patient is a 50 y.o. female presenting with shortness of breath and abdominal pain. The history is provided by the patient.  Shortness of Breath Associated symptoms: abdominal pain   Abdominal Pain Associated symptoms: shortness of breath     Past Medical History  Diagnosis Date  . Bipolar 1 disorder (HCC)   . CHF (congestive heart failure) (HCC)   . Depression   . Deafness in right ear   . Migraine headache   . Hypertension    Past Surgical History  Procedure Laterality Date  . Appendectomy    . Cesarean section    . Splenectomy, partial     History reviewed. No pertinent family history. Social History  Substance Use Topics  . Smoking status: Current Every Day Smoker -- 0.50 packs/day for 28 years    Types: Cigarettes   . Smokeless tobacco: Never Used  . Alcohol Use: No   OB History    No data available     Review of Systems  Respiratory: Positive for shortness of breath.   Gastrointestinal: Positive for abdominal pain.   10 Systems reviewed and are negative for acute change except as noted in the HPI.    Allergies  Review of patient's allergies indicates no known allergies.  Home Medications   Prior to Admission medications   Medication Sig Start Date End Date Taking? Authorizing Provider  albuterol (PROVENTIL HFA;VENTOLIN HFA) 108 (90 BASE) MCG/ACT inhaler Inhale 2 puffs into the lungs every 4 (four) hours as needed for wheezing or shortness of breath. 01/22/15  Yes Eber HongBrian Miller, MD  Ascorbic Acid (VITAMIN C PO) Take 1 tablet by mouth 3 (three) times a week.   Yes Historical Provider, MD  Aspirin-Acetaminophen-Caffeine (GOODY HEADACHE PO) Take 1 Package by mouth daily as needed (for pain).   Yes Historical Provider, MD  buPROPion (WELLBUTRIN XL) 300 MG 24 hr tablet Take 300 mg by mouth daily.   Yes Historical Provider, MD  Cyanocobalamin (VITAMIN B-12 PO) Take 1 tablet by mouth 3 (three) times a week.   Yes Historical Provider, MD  diphenhydrAMINE (BENADRYL) 25 MG tablet Take 25 mg by mouth every 6 (six) hours as needed for itching or allergies.   Yes Historical Provider, MD  azithromycin (ZITHROMAX) 250 MG tablet Take 1 tablet (250 mg total) by mouth daily. Take first 2 tablets together, then 1 every day until finished. 01/22/15   Arlys JohnBrian  Hyacinth Meeker, MD  cephALEXin (KEFLEX) 500 MG capsule Take 1 capsule (500 mg total) by mouth 2 (two) times daily. 12/13/12   Antony Madura, PA-C  HYDROcodone-acetaminophen (NORCO/VICODIN) 5-325 MG per tablet Take 1 tablet by mouth every 6 (six) hours as needed for pain. 07/17/12   Patriciaann Clan Dixon, PA-C  lisinopril (PRINIVIL,ZESTRIL) 10 MG tablet Take 1 tablet (10 mg total) by mouth daily. 09/13/15   Laurence Spates, MD  omeprazole (PRILOSEC) 20 MG capsule Take 1 capsule (20  mg total) by mouth daily. 09/13/15   Laurence Spates, MD  predniSONE (DELTASONE) 20 MG tablet Take 2 tablets (40 mg total) by mouth daily. 01/22/15   Eber Hong, MD  sulfamethoxazole-trimethoprim (SEPTRA DS) 800-160 MG per tablet Take 1 tablet by mouth every 12 (twelve) hours. 12/13/12   Antony Madura, PA-C  traMADol (ULTRAM) 50 MG tablet Take 1 tablet (50 mg total) by mouth every 6 (six) hours as needed for pain. 12/13/12   Antony Madura, PA-C   BP 164/95 mmHg  Pulse 71  Temp(Src) 98.4 F (36.9 C) (Oral)  Resp 15  SpO2 99% Physical Exam  Constitutional: She is oriented to person, place, and time. She appears well-developed and well-nourished. No distress.  HENT:  Head: Normocephalic and atraumatic.  Moist mucous membranes  Eyes: Conjunctivae are normal. Pupils are equal, round, and reactive to light.  Neck: Neck supple.  Cardiovascular: Normal rate, regular rhythm and normal heart sounds.   No murmur heard. Pulmonary/Chest: Effort normal and breath sounds normal.  Abdominal: Soft. Bowel sounds are normal. She exhibits no distension. There is tenderness.  LUQ, midepigastric, RUQ tenderness to palpation, worst in LUQ  Musculoskeletal: She exhibits no edema.  Neurological: She is alert and oriented to person, place, and time.  Fluent speech  Skin: Skin is warm and dry.  Psychiatric: She has a normal mood and affect. Judgment normal.  Nursing note and vitals reviewed.   ED Course  Procedures (including critical care time) Labs Review Labs Reviewed  BASIC METABOLIC PANEL - Abnormal; Notable for the following:    Glucose, Bld 110 (*)    All other components within normal limits  CBC - Abnormal; Notable for the following:    Hemoglobin 15.3 (*)    All other components within normal limits  BRAIN NATRIURETIC PEPTIDE - Abnormal; Notable for the following:    B Natriuretic Peptide 106.3 (*)    All other components within normal limits  URINALYSIS, ROUTINE W REFLEX MICROSCOPIC (NOT  AT [facility]) - Abnormal; Notable for the following:    Hgb urine dipstick MODERATE (*)    All other components within normal limits  URINE MICROSCOPIC-ADD ON - Abnormal; Notable for the following:    Squamous Epithelial / LPF 0-5 (*)    Bacteria, UA RARE (*)    All other components within normal limits  HEPATIC FUNCTION PANEL  LIPASE, BLOOD  I-STAT TROPOININ, ED    Imaging Review Dg Chest 2 View  09/13/2015  CLINICAL DATA:  Intermittent shortness of breath, cold and hot sweats, and epigastric pain for 3 days, personal history hypertension, CHF, smoker EXAM: CHEST  2 VIEW COMPARISON:  01/22/2015 FINDINGS: Normal heart size, mediastinal contours, and pulmonary vascularity. Lungs well expanded and clear. No pleural effusion or pneumothorax. Mild biconvex thoracolumbar scoliosis. IMPRESSION: No acute abnormalities. Electronically Signed   By: Ulyses Southward M.D.   On: 09/13/2015 13:53   I have personally reviewed and evaluated these lab results as part of my medical decision-making.  EKG Interpretation   Date/Time:  Wednesday Sep 13 2015 12:17:26 EDT Ventricular Rate:  78 PR Interval:  134 QRS Duration: 80 QT Interval:  376 QTC Calculation: 428 R Axis:   73 Text Interpretation:  Normal sinus rhythm Normal ECG Confirmed by Fayrene Fearing   MD, MARK ([id number]) on 09/13/2015 12:29:08 PM Also confirmed by Fayrene Fearing  MD,  MARK ([id number]), editor Dan Humphreys, CCT, SANDRA ([id number])  on 09/13/2015 12:58:17 PM     Medications  gi cocktail (Maalox,Lidocaine,Donnatal) (30 mLs Oral Given 09/13/15 1551)    MDM   Final diagnoses:  Epigastric pain   Patient with 3 days of intermittent epigastric pain associated with shortness of breath, pain has been constant today. She was well-appearing at presentation. Vital signs notable for hypertension, patient is not currently taking her hypertension medications. EKG unremarkable. She had upper abdominal tenderness, worst in left upper quadrant but also present in mid epigastrium and  right upper quadrant. Obtained above lab work including troponin. Chest x-ray unremarkable. Gave the patient a GI cocktail and on reexamination she stated that it improved her symptoms.  Labwork is unremarkable and shows normal blood counts, normal LFTs and lipase. Negative troponin. Because symptoms have been constant today, I feel that one troponin is sufficient. Her HEART score is </=3. She has no risk factors for PE and given her abdominal tenderness on exam as well as her improvement after GI cocktail, I feel that PE is very unlikely. The description of her symptoms and location of her pain suggests possible gastritis versus PUD. Since she has already been taking Zantac, I recommended switching to omeprazole and provided her with a prescription. She stated that she was out of her blood pressure medication and I provided her with 1 refill of lisinopril. Emphasized the importance of follow-up with PCP regarding her blood pressure as well as the symptoms. Provided her with outpatient resources. Patient voiced understanding of plan as well as return precautions and was discharged in satisfactory condition.  Laurence Spates, MD 09/14/15 [phone]

## 2015-09-13 NOTE — ED Notes (Addendum)
Pt here for intermittent epigastric pain with SOB x 3 days. Denies N,V,D. sts left neck pain. sts she hasn't been taking her BP meds.

## 2016-08-09 ENCOUNTER — Ambulatory Visit (HOSPITAL_COMMUNITY)
Admission: EM | Admit: 2016-08-09 | Discharge: 2016-08-09 | Disposition: A | Discharge status: Home / Self Care | Payer: Medicaid Other | Attending: Internal Medicine | Admitting: Internal Medicine

## 2016-08-09 ENCOUNTER — Encounter (HOSPITAL_COMMUNITY): Payer: Self-pay | Admitting: Emergency Medicine

## 2016-08-09 DIAGNOSIS — H7291 Unspecified perforation of tympanic membrane, right ear: Secondary | ICD-10-CM

## 2016-08-09 MED ORDER — AZITHROMYCIN 250 MG PO TABS: 250.0000 mg | ORAL_TABLET | Freq: Every day | ORAL | 0 refills | Status: DC

## 2016-08-09 MED ORDER — BENZONATATE 100 MG PO CAPS: 100.0000 mg | ORAL_CAPSULE | Freq: Three times a day (TID) | ORAL | 0 refills | Status: DC

## 2016-08-09 NOTE — ED Triage Notes (Signed)
The patient presented to the [facility] with a complaint of a cough and congestion x 1 week and right ear pain that started yesterday am.

## 2016-08-09 NOTE — ED Provider Notes (Signed)
CSN: [id number]     Arrival date & time 08/09/16  1605 History   None    Chief Complaint  Patient presents with  . Cough  . Otalgia   (Consider location/radiation/quality/duration/timing/severity/associated sxs/prior Treatment) The history is provided by the patient.  Cough  Cough characteristics:  Barking, non-productive and dry Sputum characteristics:  Yellow and green Severity:  Moderate Onset quality:  Gradual Duration:  1 week Timing:  Constant Progression:  Worsening Chronicity:  New Smoker: yes   Context: upper respiratory infection and weather changes   Relieved by:  Nothing Worsened by:  Smoking Ineffective treatments:  Decongestant and cough suppressants Associated symptoms: ear pain, fever, myalgias, rhinorrhea and sinus congestion   Associated symptoms: no chills and no wheezing   Ear pain:    Location:  Right   Severity:  Moderate   Onset quality:  Gradual   Duration:  1 week   Timing:  Constant   Progression:  Worsening   Chronicity:  New Otalgia  Associated symptoms: congestion, cough, ear discharge, fever and rhinorrhea   Associated symptoms: no neck pain     Past Medical History:  Diagnosis Date  . Bipolar 1 disorder (HCC)   . CHF (congestive heart failure) (HCC)   . Deafness in right ear   . Depression   . Hypertension   . Migraine headache    Past Surgical History:  Procedure Laterality Date  . APPENDECTOMY    . CESAREAN SECTION    . SPLENECTOMY, PARTIAL     History reviewed. No pertinent family history. Social History  Substance Use Topics  . Smoking status: Current Every Day Smoker    Packs/day: 0.50    Years: 28.00    Types: Cigarettes  . Smokeless tobacco: Never Used  . Alcohol use No   OB History    No data available     Review of Systems  Constitutional: Positive for fatigue and fever. Negative for appetite change and chills.  HENT: Positive for congestion, ear discharge, ear pain and rhinorrhea. Negative for sinus pain  and sinus pressure.   Eyes: Negative.   Respiratory: Positive for cough. Negative for wheezing.   Cardiovascular: Negative.   Gastrointestinal: Negative.   Genitourinary: Negative.   Musculoskeletal: Positive for myalgias. Negative for neck pain and neck stiffness.  Skin: Negative.   Neurological: Negative.   All other systems reviewed and are negative.   Allergies  Patient has no known allergies.  Home Medications   Prior to Admission medications   Medication Sig Start Date End Date Taking? Authorizing Provider  lisinopril (PRINIVIL,ZESTRIL) 10 MG tablet Take 1 tablet (10 mg total) by mouth daily. 09/13/15  Yes Laurence Spates, MD  omeprazole (PRILOSEC) 20 MG capsule Take 1 capsule (20 mg total) by mouth daily. 09/13/15  Yes Laurence Spates, MD  azithromycin (ZITHROMAX) 250 MG tablet Take 1 tablet (250 mg total) by mouth daily. Take first 2 tablets together, then 1 every day until finished. 08/09/16   Dorena Bodo, NP  benzonatate (TESSALON) 100 MG capsule Take 1 capsule (100 mg total) by mouth every 8 (eight) hours. 08/09/16   Dorena Bodo, NP   Meds Ordered and Administered this Visit  Medications - No data to display  BP 122/83 (BP Location: Right Arm)   Pulse (!) 126   Temp 98.1 F (36.7 C) (Oral)   Resp 18   LMP 04/14/2011   SpO2 97%  No data found.   Physical Exam  Constitutional: She is oriented to  person, place, and time. She appears well-developed and well-nourished. No distress.  HENT:  Head: Normocephalic and atraumatic.  Left Ear: External ear normal.  Nose: Nose normal.  Mouth/Throat: Oropharynx is clear and moist.  Right TM rupture  Eyes: Conjunctivae are normal. Right eye exhibits no discharge. Left eye exhibits no discharge.  Neck: Normal range of motion. Neck supple.  Cardiovascular: Normal rate and regular rhythm.   Pulmonary/Chest: Effort normal and breath sounds normal.  Abdominal: Soft. Bowel sounds are normal.  Neurological: She  is alert and oriented to person, place, and time.  Skin: Skin is warm and dry. Capillary refill takes less than 2 seconds. She is not diaphoretic.  Psychiatric: She has a normal mood and affect. Her behavior is normal.  Nursing note and vitals reviewed.   Urgent Care Course     Procedures (including critical care time)  Labs Review Labs Reviewed - No data to display  Imaging Review No results found.    MDM   1. Ruptured tympanic membrane, right    Pt referred to ENT for follow up care and management of ruptured TM. Started on Azithromycin and given Tessalon. Provided counseling on OTC medicines for symptom relief. Encouraged to follow up with primary care if symptoms persist.     Dorena Bodo, NP 08/09/16 1706

## 2016-08-09 NOTE — Discharge Instructions (Signed)
For your ruptured TM, I have provided the contact information for a ear, nose, and throat doctor. Schedule a follow up exam with this doctor to monitor your healing. To treat possible infection, I have prescribed azithromycin, take two tablets today, then 1 daily till finished. For cough, I have prescribed a medication called Tessalon. Take 1 tablet every 8 hours as needed for your cough. I also recommend an over the counter antihistamine such as claritin or zyrtec every day, and flonase nasal spray daily as well.

## 2017-05-19 ENCOUNTER — Ambulatory Visit (HOSPITAL_COMMUNITY)
Admission: EM | Admit: 2017-05-19 | Discharge: 2017-05-19 | Disposition: A | Discharge status: Home / Self Care | Payer: Medicaid Other | Attending: Family Medicine | Admitting: Family Medicine

## 2017-05-19 ENCOUNTER — Encounter (HOSPITAL_COMMUNITY): Payer: Self-pay | Admitting: Family Medicine

## 2017-05-19 ENCOUNTER — Other Ambulatory Visit: Payer: Self-pay

## 2017-05-19 DIAGNOSIS — H6693 Otitis media, unspecified, bilateral: Secondary | ICD-10-CM

## 2017-05-19 MED ORDER — FLUTICASONE PROPIONATE 50 MCG/ACT NA SUSP: 2.0000 | Freq: Every day | NASAL | 12 refills | Status: AC

## 2017-05-19 MED ORDER — AMOXICILLIN-POT CLAVULANATE 875-125 MG PO TABS: 1.0000 | ORAL_TABLET | Freq: Two times a day (BID) | ORAL | 0 refills | Status: DC

## 2017-05-19 NOTE — Discharge Instructions (Signed)
Your hearing will take at least 5 days to clear.  Use a nasal spray to start on blocking the sinus congestion.  The antibiotic should help control the infection as well.

## 2017-05-19 NOTE — ED Triage Notes (Signed)
[facility] and [facility], pt has taken Mucinex and Tylenol but has no relief

## 2017-05-19 NOTE — ED Provider Notes (Signed)
[facility]   [id number] 05/19/17 Arrival Time: 1647   SUBJECTIVE:  Paula Vargas is a 52 y.o. female who presents to the urgent care with complaint of congestion and bilateral ear pain x4 days, pt has taken Mucinex and Tylenol but has no relief  Unemployed.    Hearing loss assoc with above  Past Medical History:  Diagnosis Date  . Bipolar 1 disorder (HCC)   . CHF (congestive heart failure) (HCC)   . Deafness in right ear   . Depression   . Hypertension   . Migraine headache    History reviewed. No pertinent family history. Social History   Socioeconomic History  . Marital status: Legally Separated    Spouse name: Not on file  . Number of children: Not on file  . Years of education: Not on file  . Highest education level: Not on file  Social Needs  . Financial resource strain: Not on file  . Food insecurity - worry: Not on file  . Food insecurity - inability: Not on file  . Transportation needs - medical: Not on file  . Transportation needs - non-medical: Not on file  Occupational History  . Not on file  Tobacco Use  . Smoking status: Current Every Day Smoker    Packs/day: 0.50    Years: 28.00    Pack years: 14.00    Types: Cigarettes  . Smokeless tobacco: Never Used  Substance and Sexual Activity  . Alcohol use: No  . Drug use: No  . Sexual activity: Not Currently  Other Topics Concern  . Not on file  Social History Narrative  . Not on file   Current Meds  Medication Sig  . lisinopril (PRINIVIL,ZESTRIL) 10 MG tablet Take 1 tablet (10 mg total) by mouth daily.   No Known Allergies    ROS: As per HPI, remainder of ROS negative.   OBJECTIVE:   Vitals:   05/19/17 1822  BP: (!) 134/111  Pulse: (!) 104  Resp: 18  Temp: 98.1 F (36.7 C)  TempSrc: Oral  SpO2: 97%     General appearance: alert; no distress Eyes: PERRL; EOMI; conjunctiva normal HENT: normocephalic; atraumatic; TMs markedly deformed, canal normal, external ears  normal without trauma; nasal mucosa normal; oral mucosa normal Neck: supple Back: no CVA tenderness Extremities: no cyanosis or edema; symmetrical with no gross deformities Skin: warm and dry Neurologic: normal gait; grossly normal Psychological: alert and cooperative; normal mood and affect      Labs:  Results for orders placed or performed during the hospital encounter of 09/13/15  Basic metabolic panel  Result Value Ref Range   Sodium 140 135 - 145 mmol/L   Potassium 3.9 3.5 - 5.1 mmol/L   Chloride 106 101 - 111 mmol/L   CO2 26 22 - 32 mmol/L   Glucose, Bld 110 (H) 65 - 99 mg/dL   BUN 6 6 - 20 mg/dL   Creatinine, Ser 8.290.82 0.44 - 1.00 mg/dL   Calcium 9.9 8.9 - 56.210.3 mg/dL   GFR calc non Af Amer >60 >60 mL/min   GFR calc Af Amer >60 >60 mL/min   Anion gap 8 5 - 15  CBC  Result Value Ref Range   WBC 8.7 4.0 - 10.5 K/uL   RBC 4.63 3.87 - 5.11 MIL/uL   Hemoglobin 15.3 (H) 12.0 - 15.0 g/dL   HCT 13.046.0 86.536.0 - 78.446.0 %   MCV 99.4 78.0 - 100.0 fL   MCH 33.0 26.0 - 34.0  pg   MCHC 33.3 30.0 - 36.0 g/dL   RDW 16.1 09.6 - 04.5 %   Platelets 276 150 - 400 K/uL  Hepatic function panel  Result Value Ref Range   Total Protein 7.2 6.5 - 8.1 g/dL   Albumin 4.2 3.5 - 5.0 g/dL   AST 24 15 - 41 U/L   ALT 17 14 - 54 U/L   Alkaline Phosphatase 81 38 - 126 U/L   Total Bilirubin 0.6 0.3 - 1.2 mg/dL   Bilirubin, Direct 0.2 0.1 - 0.5 mg/dL   Indirect Bilirubin 0.4 0.3 - 0.9 mg/dL  Lipase, blood  Result Value Ref Range   Lipase 25 11 - 51 U/L  Brain natriuretic peptide  Result Value Ref Range   B Natriuretic Peptide 106.3 (H) 0.0 - 100.0 pg/mL  Urinalysis, Routine w reflex microscopic  Result Value Ref Range   Color, Urine YELLOW YELLOW   APPearance CLEAR CLEAR   Specific Gravity, Urine 1.011 1.005 - 1.030   pH 5.5 5.0 - 8.0   Glucose, UA NEGATIVE NEGATIVE mg/dL   Hgb urine dipstick MODERATE (A) NEGATIVE   Bilirubin Urine NEGATIVE NEGATIVE   Ketones, ur NEGATIVE NEGATIVE mg/dL    Protein, ur NEGATIVE NEGATIVE mg/dL   Nitrite NEGATIVE NEGATIVE   Leukocytes, UA NEGATIVE NEGATIVE  Urine microscopic-add on  Result Value Ref Range   Squamous Epithelial / LPF 0-5 (A) NONE SEEN   WBC, UA 0-5 0 - 5 WBC/hpf   RBC / HPF 6-30 0 - 5 RBC/hpf   Bacteria, UA RARE (A) NONE SEEN  I-stat troponin, ED  Result Value Ref Range   Troponin i, poc 0.00 0.00 - 0.08 ng/mL   Comment 3            Labs Reviewed - No data to display  No results found.     ASSESSMENT & PLAN:  1. Acute otitis media, bilateral     Meds ordered this encounter  Medications  . amoxicillin-clavulanate (AUGMENTIN) 875-125 MG tablet    Sig: Take 1 tablet by mouth every 12 (twelve) hours.    Dispense:  14 tablet    Refill:  0  . fluticasone (FLONASE) 50 MCG/ACT nasal spray    Sig: Place 2 sprays into both nostrils daily.    Dispense:  16 g    Refill:  12    Reviewed expectations re: course of current medical issues. Questions answered. Outlined signs and symptoms indicating need for more acute intervention. Patient verbalized understanding. After Visit Summary given.      Elvina Sidle, MD 05/19/17 [phone]

## 2017-12-20 ENCOUNTER — Encounter (HOSPITAL_COMMUNITY): Payer: Self-pay | Admitting: Emergency Medicine

## 2017-12-20 ENCOUNTER — Emergency Department (HOSPITAL_COMMUNITY)
Admission: EM | Admit: 2017-12-20 | Discharge: 2017-12-20 | Disposition: A | Discharge status: Home / Self Care | Payer: Medicaid Other | Attending: Emergency Medicine | Admitting: Emergency Medicine

## 2017-12-20 ENCOUNTER — Emergency Department (HOSPITAL_COMMUNITY): Payer: Medicaid Other

## 2017-12-20 DIAGNOSIS — I110 Hypertensive heart disease with heart failure: Secondary | ICD-10-CM | POA: Insufficient documentation

## 2017-12-20 DIAGNOSIS — R1031 Right lower quadrant pain: Secondary | ICD-10-CM | POA: Diagnosis present

## 2017-12-20 DIAGNOSIS — Z79899 Other long term (current) drug therapy: Secondary | ICD-10-CM | POA: Diagnosis not present

## 2017-12-20 DIAGNOSIS — Y929 Unspecified place or not applicable: Secondary | ICD-10-CM | POA: Insufficient documentation

## 2017-12-20 DIAGNOSIS — F17210 Nicotine dependence, cigarettes, uncomplicated: Secondary | ICD-10-CM | POA: Insufficient documentation

## 2017-12-20 DIAGNOSIS — Y939 Activity, unspecified: Secondary | ICD-10-CM | POA: Insufficient documentation

## 2017-12-20 DIAGNOSIS — Y999 Unspecified external cause status: Secondary | ICD-10-CM | POA: Diagnosis not present

## 2017-12-20 DIAGNOSIS — T148XXA Other injury of unspecified body region, initial encounter: Secondary | ICD-10-CM

## 2017-12-20 DIAGNOSIS — I509 Heart failure, unspecified: Secondary | ICD-10-CM | POA: Insufficient documentation

## 2017-12-20 DIAGNOSIS — M545 Low back pain: Secondary | ICD-10-CM

## 2017-12-20 DIAGNOSIS — X58XXXA Exposure to other specified factors, initial encounter: Secondary | ICD-10-CM | POA: Insufficient documentation

## 2017-12-20 DIAGNOSIS — S39012A Strain of muscle, fascia and tendon of lower back, initial encounter: Secondary | ICD-10-CM | POA: Diagnosis not present

## 2017-12-20 LAB — URINALYSIS, ROUTINE W REFLEX MICROSCOPIC
BILIRUBIN URINE: NEGATIVE
GLUCOSE, UA: NEGATIVE mg/dL
Ketones, ur: NEGATIVE mg/dL
LEUKOCYTES UA: NEGATIVE
Nitrite: NEGATIVE
PH: 5 (ref 5.0–8.0)
Protein, ur: NEGATIVE mg/dL
SPECIFIC GRAVITY, URINE: 1.019 (ref 1.005–1.030)

## 2017-12-20 LAB — CBC WITH DIFFERENTIAL/PLATELET
Abs Immature Granulocytes: 0 10*3/uL (ref 0.0–0.1)
BASOS ABS: 0.1 10*3/uL (ref 0.0–0.1)
BASOS PCT: 1 %
EOS ABS: 0.1 10*3/uL (ref 0.0–0.7)
Eosinophils Relative: 1 %
HEMATOCRIT: 38 % (ref 36.0–46.0)
Hemoglobin: 12.5 g/dL (ref 12.0–15.0)
IMMATURE GRANULOCYTES: 0 %
LYMPHS ABS: 2.5 10*3/uL (ref 0.7–4.0)
Lymphocytes Relative: 40 %
MCH: 33.3 pg (ref 26.0–34.0)
MCHC: 32.9 g/dL (ref 30.0–36.0)
MCV: 101.3 fL — ABNORMAL HIGH (ref 78.0–100.0)
Monocytes Absolute: 0.6 10*3/uL (ref 0.1–1.0)
Monocytes Relative: 10 %
NEUTROS PCT: 48 %
Neutro Abs: 2.9 10*3/uL (ref 1.7–7.7)
PLATELETS: 259 10*3/uL (ref 150–400)
RBC: 3.75 MIL/uL — AB (ref 3.87–5.11)
RDW: 12.3 % (ref 11.5–15.5)
WBC: 6.2 10*3/uL (ref 4.0–10.5)

## 2017-12-20 LAB — COMPREHENSIVE METABOLIC PANEL
ALT: 28 U/L (ref 0–44)
AST: 29 U/L (ref 15–41)
Albumin: 3.7 g/dL (ref 3.5–5.0)
Alkaline Phosphatase: 82 U/L (ref 38–126)
Anion gap: 9 (ref 5–15)
BUN: 16 mg/dL (ref 6–20)
CHLORIDE: 105 mmol/L (ref 98–111)
CO2: 24 mmol/L (ref 22–32)
CREATININE: 1.53 mg/dL — AB (ref 0.44–1.00)
Calcium: 9 mg/dL (ref 8.9–10.3)
GFR, EST AFRICAN AMERICAN: 44 mL/min — AB (ref >60)
GFR, EST NON AFRICAN AMERICAN: 38 mL/min — AB (ref >60)
Glucose, Bld: 146 mg/dL — ABNORMAL HIGH (ref 70–99)
POTASSIUM: 4.1 mmol/L (ref 3.5–5.1)
Sodium: 138 mmol/L (ref 135–145)
Total Bilirubin: 0.6 mg/dL (ref 0.3–1.2)
Total Protein: 6.4 g/dL — ABNORMAL LOW (ref 6.5–8.1)

## 2017-12-20 LAB — I-STAT CG4 LACTIC ACID, ED
LACTIC ACID, VENOUS: 1.8 mmol/L (ref 0.5–1.9)
Lactic Acid, Venous: 1.07 mmol/L (ref 0.5–1.9)

## 2017-12-20 MED ORDER — SODIUM CHLORIDE 0.9 % IV BOLUS
1000.0000 mL | Freq: Once | INTRAVENOUS | Status: AC
  Administered 2017-12-20: 1000 mL via INTRAVENOUS

## 2017-12-20 MED ORDER — METHOCARBAMOL 500 MG PO TABS: 500.0000 mg | ORAL_TABLET | Freq: Two times a day (BID) | ORAL | 0 refills | Status: DC

## 2017-12-20 MED ORDER — FENTANYL CITRATE (PF) 100 MCG/2ML IJ SOLN
100.0000 ug | Freq: Once | INTRAMUSCULAR | Status: AC
  Administered 2017-12-20: 100 ug via INTRAMUSCULAR
  Filled 2017-12-20: qty 2

## 2017-12-20 NOTE — ED Notes (Signed)
Pt returned from CT °

## 2017-12-20 NOTE — ED Provider Notes (Signed)
[facility] EMERGENCY DEPARTMENT Provider Note   CSN: [id number] Arrival date & time: 12/20/17  1517     History   Chief Complaint Chief Complaint  Patient presents with  . Back Pain  . Leg Pain    HPI Paula Vargas is a 52 y.o. female past medical history of bipolar 1, CHF, depression, hypertension who presents for evaluation of right flank pain that radiates to the right abdomen that has been ongoing for last 2 days.  Patient reports that approximately 6 days ago, she was diagnosed with a UTI and was started on Bactrim by her primary care doctor.  She states that initially she had gone to PCP because she was having some dysuria.  PCP did note some blood in urine and started her on Bactrim which she states that she has been taking.  She states that over the last 2 days, she is starting having some right-sided flank pain that radiates to the right abdomen.  She states she has been taking ibuprofen with no improvement in the pain.  She does feel little nauseous but denies any vomiting.  She has been able to eat and drink without any difficulty.  Patient states she still has some discomfort with urination.  She has not noticed any hematuria.  Patient states that her last bowel movement was yesterday was normal.  No blood noted.  Patient states that she has not had any preceding trauma, injury, fall.  Patient denies any fevers, chest pain, difficulty breathing, vaginal bleeding, vaginal discharge, numbness/weakness of her arms or legs, difficulty in bleeding.  The history is provided by the patient.    Past Medical History:  Diagnosis Date  . Bipolar 1 disorder (HCC)   . CHF (congestive heart failure) (HCC)   . Deafness in right ear   . Depression   . Hypertension   . Migraine headache     Patient Active Problem List   Diagnosis Date Noted  . Depression   . Deafness in right ear   . Migraine headache   . Hypertension   . Bipolar 1 disorder [facility])     Past  Surgical History:  Procedure Laterality Date  . APPENDECTOMY    . CESAREAN SECTION    . SPLENECTOMY, PARTIAL       OB History   None      Home Medications    Prior to Admission medications   Medication Sig Start Date End Date Taking? Authorizing Provider  amoxicillin-clavulanate (AUGMENTIN) 875-125 MG tablet Take 1 tablet by mouth every 12 (twelve) hours. 05/19/17   Elvina Sidle, MD  fluticasone (FLONASE) 50 MCG/ACT nasal spray Place 2 sprays into both nostrils daily. 05/19/17   Elvina Sidle, MD  lisinopril (PRINIVIL,ZESTRIL) 10 MG tablet Take 1 tablet (10 mg total) by mouth daily. 09/13/15   Little, Ambrose Finland, MD  methocarbamol (ROBAXIN) 500 MG tablet Take 1 tablet (500 mg total) by mouth 2 (two) times daily. 12/20/17   Maxwell Caul, PA-C    Family History No family history on file.  Social History Social History   Tobacco Use  . Smoking status: Current Every Day Smoker    Packs/day: 0.50    Years: 28.00    Pack years: 14.00    Types: Cigarettes  . Smokeless tobacco: Never Used  Substance Use Topics  . Alcohol use: No  . Drug use: No     Allergies   Patient has no known allergies.   Review of Systems Review  of Systems  Constitutional: Negative for fever.  Respiratory: Negative for cough and shortness of breath.   Cardiovascular: Negative for chest pain.  Gastrointestinal: Positive for abdominal pain and nausea. Negative for vomiting.  Genitourinary: Positive for dysuria and flank pain. Negative for hematuria, vaginal bleeding and vaginal discharge.  Neurological: Negative for headaches.  All other systems reviewed and are negative.    Physical Exam Updated Vital Signs BP 115/65 (BP Location: Left Arm)   Pulse 78   Temp 97.8 F (36.6 C) (Oral)   Resp 18   LMP 04/14/2011   SpO2 100%   Physical Exam  Constitutional: She is oriented to person, place, and time. She appears well-developed and well-nourished.  Sitting comfortably on  examination table  HENT:  Head: Normocephalic and atraumatic.  Mouth/Throat: Oropharynx is clear and moist and mucous membranes are normal.  Eyes: Pupils are equal, round, and reactive to light. Conjunctivae, EOM and lids are normal.  Neck: Full passive range of motion without pain.  Cardiovascular: Normal rate, regular rhythm, normal heart sounds and normal pulses. Exam reveals no gallop and no friction rub.  No murmur heard. Pulmonary/Chest: Effort normal and breath sounds normal.  Lungs clear to auscultation bilaterally.  Symmetric chest rise.  No wheezing, rales, rhonchi.  Abdominal: Soft. Normal appearance. There is tenderness in the right lower quadrant. There is CVA tenderness (Right sided). There is no rigidity and no guarding.  Abdomen soft, nondistended.  Diffuse tenderness noted to the right lower quadrant.  Patient also with some right-sided CVA tenderness.  No rigidity, guarding.  Musculoskeletal: Normal range of motion.  Neurological: She is alert and oriented to person, place, and time.  5/5 strength of BUE and BLE Negative SLE Sensation intact along major nerve distributions of BUE and BLE  Skin: Skin is warm and dry. Capillary refill takes less than 2 seconds.  Psychiatric: She has a normal mood and affect. Her speech is normal.  Nursing note and vitals reviewed.    ED Treatments / Results  Labs (all labs ordered are listed, but only abnormal results are displayed) Labs Reviewed  COMPREHENSIVE METABOLIC PANEL - Abnormal; Notable for the following components:      Result Value   Glucose, Bld 146 (*)    Creatinine, Ser 1.53 (*)    Total Protein 6.4 (*)    GFR calc non Af Amer 38 (*)    GFR calc Af Amer 44 (*)    All other components within normal limits  CBC WITH DIFFERENTIAL/PLATELET - Abnormal; Notable for the following components:   RBC 3.75 (*)    MCV 101.3 (*)    All other components within normal limits  URINALYSIS, ROUTINE W REFLEX MICROSCOPIC - Abnormal;  Notable for the following components:   APPearance HAZY (*)    Hgb urine dipstick SMALL (*)    Bacteria, UA RARE (*)    All other components within normal limits  I-STAT CG4 LACTIC ACID, ED  I-STAT CG4 LACTIC ACID, ED    EKG None  Radiology Ct Renal Stone Study  Result Date: 12/20/2017 CLINICAL DATA:  Hematuria EXAM: CT ABDOMEN AND PELVIS WITHOUT CONTRAST TECHNIQUE: Multidetector CT imaging of the abdomen and pelvis was performed following the standard protocol without IV contrast. COMPARISON:  11/20/2011 FINDINGS: Lower chest: Lung bases are clear. Hepatobiliary: No focal hepatic lesion. No biliary duct dilatation. Gallbladder is normal. Common bile duct is normal. Pancreas: Pancreas is normal. No ductal dilatation. No pancreatic inflammation. Spleen: A partially calcified lobular lesions superior  to spleen measuring 4.6 x 3.7 cm is presumed residual of a large splenic hematoma seen on CT 03/13/2008. Findings stable and contracted over more recent comparison exams. Adrenals/urinary tract: Adrenal glands normal. Kidneys, ureters, and bladder normal. No nephrolithiasis, ureterolithiasis or obstructive uropathy. No bladder calculi. Stomach/Bowel: Stomach, small-bowel cecum normal. Post appendectomy. The colon and rectosigmoid colon are normal. Vascular/Lymphatic: Abdominal aorta is normal caliber with atherosclerotic calcification. There is no retroperitoneal or periportal lymphadenopathy. No pelvic lymphadenopathy. Reproductive: Uterus and ovaries normal. Other: No free fluid.  Ventral hernia Musculoskeletal: No aggressive osseous lesion. IMPRESSION: 1. No nephrolithiasis or ureterolithiasis.  No bladder calculi. 2. No explanation for hematuria on noncontrast exam. 3. Residual of splenic hematoma superior to the spleen. Electronically Signed   By: Genevive Bi M.D.   On: 12/20/2017 18:26    Procedures Procedures (including critical care time)  Medications Ordered in ED Medications  fentaNYL  (SUBLIMAZE) injection 100 mcg (100 mcg Intramuscular Given 12/20/17 1757)  sodium chloride 0.9 % bolus 1,000 mL (0 mLs Intravenous Stopped 12/20/17 2016)     Initial Impression / Assessment and Plan / ED Course  I have reviewed the triage vital signs and the nursing notes.  Pertinent labs & imaging results that were available during my care of the patient were reviewed by me and considered in my medical decision making (see chart for details).     52 year old female who presents for evaluation of right flank pain x2 days.  Reports recently diagnosed with UTI and started on Bactrim which she states she has been compliant with.  Started having flank pain 2 days ago.  Associate with nausea.  No fevers, vomiting.  Last bowel movement was yesterday. Patient is afebrile, non-toxic appearing, sitting comfortably on examination table. Vital signs reviewed and stable.  Patient does exhibit some right-sided CVA and abdominal tenderness.  Consider pyelonephritis versus kidney stone versus acute infectious etiology vs MSK etiology.  She/physical exam is not concerning for aortic dissection, cauda equina, spinal abscess.  Initial labs ordered at triage.  I-STAT lactic acid negative.  UA shows small hemoglobin, rare bacteria casts.  CMP shows creatinine slightly elevated at 1.53.  Most recent creatinine was approximately 2 years ago and was 0.82 at that time.  Patient is receiving fluids.  CT renal study shows no evidence of nephrolithiasis.  No concerning for pyelonephritis.  Patient does have some calcified lingular lesions noted to the spleen and had had a previous splenic hematoma on the CT in 2009.  Patient with no left upper quadrant or left lower quadrant tenderness.  Discussed results with patient.  She reports improvement in pain after analgesics.  Patient is able to ambulate in the department any difficulty.  She is hemodynamically stable.  Suspect that this may be muscular skeletal in etiology.   Instructed patient to continue taking her Bactrim for her UTI.  Encourage primary care follow-up.  Will give short course of analgesics to help with pain. Patient had ample opportunity for questions and discussion. All patient's questions were answered with full understanding. Strict return precautions discussed. Patient expresses understanding and agreement to plan.   Final Clinical Impressions(s) / ED Diagnoses   Final diagnoses:  Acute right-sided low back pain, with sciatica presence unspecified  Muscle strain    ED Discharge Orders         Ordered    methocarbamol (ROBAXIN) 500 MG tablet  2 times daily     12/20/17 2001  Maxwell CaulLayden, Coreyon Nicotra A, PA-C 12/21/17 0207    Virgina Norfolkuratolo, Adam, DO 12/21/17 1123

## 2017-12-20 NOTE — ED Triage Notes (Signed)
Pt states she was prescribed meds (sulfa?)  for a UTI by her PCP, she has been taking it as directed. Pt states she was told her urine had blood in it. Pt states she had back pain when she went in, but states she is now having shooting pain from the right lower back down into the right leg, that is causing pain with ambulation.

## 2017-12-20 NOTE — Discharge Instructions (Signed)
You can take 1000 mg of Tylenol.  Do not exceed 4000 mg of Tylenol a day.  Take Robaxin as prescribed. This medication will make you drowsy so do not drive or drink alcohol when taking it.  Apply heat to the affected area.   Finish the antibiotic that your doctor prescribed.  Follow-up with your primary care doctor.  Call your office on Tuesday to arrange an appointment in the next few days.  Return to emergency department for any worsening back pain, numbness/weakness of your arms or legs, difficulty walking, fevers, abdominal pain, nausea/vomiting or any other worsening or concerning symptoms.

## 2017-12-20 NOTE — ED Notes (Signed)
Pt ambulated to the bathroom independently.

## 2019-01-09 ENCOUNTER — Encounter (HOSPITAL_COMMUNITY): Payer: Self-pay

## 2019-01-09 ENCOUNTER — Other Ambulatory Visit: Payer: Self-pay

## 2019-01-09 ENCOUNTER — Ambulatory Visit (HOSPITAL_COMMUNITY)
Admission: EM | Admit: 2019-01-09 | Discharge: 2019-01-09 | Disposition: A | Discharge status: Home / Self Care | Payer: Medicaid Other | Attending: Emergency Medicine | Admitting: Emergency Medicine

## 2019-01-09 DIAGNOSIS — F319 Bipolar disorder, unspecified: Secondary | ICD-10-CM | POA: Diagnosis not present

## 2019-01-09 DIAGNOSIS — I110 Hypertensive heart disease with heart failure: Secondary | ICD-10-CM | POA: Diagnosis not present

## 2019-01-09 DIAGNOSIS — J029 Acute pharyngitis, unspecified: Secondary | ICD-10-CM | POA: Diagnosis present

## 2019-01-09 DIAGNOSIS — Z8249 Family history of ischemic heart disease and other diseases of the circulatory system: Secondary | ICD-10-CM | POA: Diagnosis not present

## 2019-01-09 DIAGNOSIS — H9202 Otalgia, left ear: Secondary | ICD-10-CM | POA: Diagnosis not present

## 2019-01-09 DIAGNOSIS — Z20828 Contact with and (suspected) exposure to other viral communicable diseases: Secondary | ICD-10-CM | POA: Insufficient documentation

## 2019-01-09 DIAGNOSIS — I509 Heart failure, unspecified: Secondary | ICD-10-CM | POA: Insufficient documentation

## 2019-01-09 DIAGNOSIS — F17210 Nicotine dependence, cigarettes, uncomplicated: Secondary | ICD-10-CM | POA: Insufficient documentation

## 2019-01-09 DIAGNOSIS — Z9081 Acquired absence of spleen: Secondary | ICD-10-CM | POA: Diagnosis not present

## 2019-01-09 DIAGNOSIS — Z79899 Other long term (current) drug therapy: Secondary | ICD-10-CM | POA: Insufficient documentation

## 2019-01-09 LAB — POCT RAPID STREP A: Streptococcus, Group A Screen (Direct): NEGATIVE

## 2019-01-09 MED ORDER — IBUPROFEN 600 MG PO TABS: 600.0000 mg | ORAL_TABLET | Freq: Four times a day (QID) | ORAL | 0 refills | Status: AC | PRN

## 2019-01-09 MED ORDER — OFLOXACIN 0.3 % OT SOLN: 5.0000 [drp] | Freq: Two times a day (BID) | OTIC | 0 refills | Status: AC

## 2019-01-09 NOTE — ED Provider Notes (Signed)
HPI  SUBJECTIVE:  Patient reports a left-sided sore throat and left earache starting 3 days ago..  She has tried [phone] mg of Tylenol, throat lozenges.  The throat lozenges help.  No aggravating factors.  Reports feeling feverish, but does not have a thermometer at home.  She reports decreased hearing in the left ear.  No body aches.  She reports a mild frontal headache.  No nasal congestion, rhinorrhea, postnasal drip, coughing, wheezing, shortness of breath, abdominal pain.  No nausea, vomiting, diarrhea.  No otorrhea, her ear pain is associated chewing or yawning.  No recent swimming.  No neck stiffness, drooling, trismus, voice changes, or rash.  No sensation of throat swelling shut, difficulty breathing.  No allergy or GERD symptoms.  No antibiotics in the past month.  She took Tylenol within 4 to 6 hours of evaluation.  No exposure to strep, mono, COVID.  She has a past medical history of hypertension, frequent otitis media, CHF, GERD for which she does not take any medications.  No history of diabetes, frequent strep or mono.  PMD: Dr. Lezlie OctaveLauding   Past Medical History:  Diagnosis Date  . Bipolar 1 disorder (HCC)   . CHF (congestive heart failure) (HCC)   . Deafness in right ear   . Depression   . Hypertension   . Migraine headache     Past Surgical History:  Procedure Laterality Date  . APPENDECTOMY    . CESAREAN SECTION    . SPLENECTOMY, PARTIAL      Family History  Problem Relation Age of Onset  . Hypertension Mother   . Hyperlipidemia Mother   . Hypertension Father   . Hyperlipidemia Father     Social History   Tobacco Use  . Smoking status: Current Every Day Smoker    Packs/day: 0.50    Years: 28.00    Pack years: 14.00    Types: Cigarettes  . Smokeless tobacco: Never Used  Substance Use Topics  . Alcohol use: No  . Drug use: No    No current facility-administered medications for this encounter.   Current Outpatient Medications:  .  hydrALAZINE  (APRESOLINE) 10 MG tablet, Take 40 mg by mouth once., Disp: , Rfl:  .  lisinopril (PRINIVIL,ZESTRIL) 10 MG tablet, Take 1 tablet (10 mg total) by mouth daily., Disp: 30 tablet, Rfl: 0 .  fluticasone (FLONASE) 50 MCG/ACT nasal spray, Place 2 sprays into both nostrils daily., Disp: 16 g, Rfl: 12 .  ibuprofen (ADVIL) 600 MG tablet, Take 1 tablet (600 mg total) by mouth every 6 (six) hours as needed., Disp: 30 tablet, Rfl: 0 .  ofloxacin (FLOXIN) 0.3 % OTIC solution, Place 5 drops into the left ear 2 (two) times daily. x3-5 days, Disp: 10 mL, Rfl: 0  No Known Allergies   ROS  As noted in HPI.   Physical Exam  BP 117/88   Pulse 78   Temp 98.5 F (36.9 C)   Resp 19   LMP 04/14/2011   SpO2 98%   Constitutional: Well developed, well nourished, no acute distress Eyes:  EOMI, conjunctiva normal bilaterally HENT: Normocephalic, atraumatic,mucus membranes moist. +  nasal congestion + slightly erythematous oropharynx - enlarged tonsils - exudates. Uvula midline.  No petechiae on palate.  No postnasal drip.  Right TM normal.  Left external ear normal.  Mild pain with traction on pinna and palpation of tragus.  No pain with palpation of mastoid.  Ear partially obscured by cerumen.  Positive erythema of the top of  the external ear canal.  No TMJ tenderness, crepitus.  Was able to get a better view after curetting cerumen out of the way.  No pus or fluid behind the left TM.  TM intact but slightly erythematous at the top. Respiratory: Normal inspiratory effort Cardiovascular: Normal rate, no murmurs, rubs, gallops GI: nondistended, nontender. No appreciable splenomegaly skin: No rash, skin intact Lymph: -  Anterior cervical LN.  No posterior cervical lymphadenopathy Musculoskeletal: no deformities Neurologic: Alert & oriented x 3, no focal neuro deficits Psychiatric: Speech and behavior appropriate  ED Course   Medications - No data to display  Orders Placed This Encounter  Procedures  .  Novel Coronavirus, NAA (Hosp order, Send-out to Ref Lab; TAT 18-24 hrs    Standing Status:   Standing    Number of Occurrences:   1    Order Specific Question:   Is this test for diagnosis or screening    Answer:   Diagnosis of ill patient    Order Specific Question:   Symptomatic for COVID-19 as defined by CDC    Answer:   Yes    Order Specific Question:   Date of Symptom Onset    Answer:   01/06/2019    Order Specific Question:   Hospitalized for COVID-19    Answer:   No    Order Specific Question:   Admitted to ICU for COVID-19    Answer:   No    Order Specific Question:   Previously tested for COVID-19    Answer:   No    Order Specific Question:   Resident in a congregate (group) care setting    Answer:   No    Order Specific Question:   Employed in healthcare setting    Answer:   No    Order Specific Question:   Pregnant    Answer:   No  . POCT rapid strep A [facility])    Standing Status:   Standing    Number of Occurrences:   1    Results for orders placed or performed during the hospital encounter of 01/09/19 (from the past 24 hour(s))  POCT rapid strep A [facility])     Status: None   Collection Time: 01/09/19  3:36 PM  Result Value Ref Range   Streptococcus, Group A Screen (Direct) NEGATIVE NEGATIVE   No results found.  ED Clinical Impression  1. Pharyngitis, unspecified etiology   2. Left ear pain      ED Assessment/Plan   Rapid strep negative. Obtaining throat culture to guide antibiotic treatment.  Suspect viral process versus GERD.  She does not have an otitis media.  I accidentally scraped her external ear canal while curetting out wax, so we will send home with antibiotic eardrops.  Discussed this with patient. We'll contact them if culture is positive, and will call in Appropriate antibiotics. Patient home with ibuprofen, Tylenol, Benadryl/Maalox mixture, advised to restart GERD medications.  COVID test sent. Patient to followup with PMD when  necessary,    Discussed labs,  MDM, plan and followup with patient. Discussed sn/sx that should prompt return to the ED. patient agrees with plan.   Meds ordered this encounter  Medications  . ibuprofen (ADVIL) 600 MG tablet    Sig: Take 1 tablet (600 mg total) by mouth every 6 (six) hours as needed.    Dispense:  30 tablet    Refill:  0  . ofloxacin (FLOXIN) 0.3 % OTIC solution  Sig: Place 5 drops into the left ear 2 (two) times daily. x3-5 days    Dispense:  10 mL    Refill:  0     *This clinic note was created using Scientist, clinical (histocompatibility and immunogenetics). Therefore, there may be occasional mistakes despite careful proofreading.    Domenick Gong, MD 01/10/19 1011

## 2019-01-09 NOTE — Discharge Instructions (Addendum)
your rapid strep was negative today, so we have sent off a throat culture.  We have also sent off COVID testing.  We will contact you and call in the appropriate antibiotics if your culture comes back positive for an infection requiring antibiotic treatment.  Give [place] a working phone number. 1 gram of Tylenol and 600 mg ibuprofen together 3-4 times a day as needed for pain.  Make sure you drink plenty of extra fluids.  Some people find salt water gargles and  Traditional Medic[employer]'s "Throat Coat" tea helpful. Take 5 mL of liquid Benadryl and 5 mL of Maalox. Mix it together, and then hold it in your mouth for as long as you can and then swallow. You may do this 4 times a day.  Consider restarting your acid reflux medications.  Acid reflux can cause a severe sore throat.  I have given you antibiotic eardrops to help prevent an infection in your ear canal because it got slightly scraped when I was clearing out the wax.  Go to www.goodrx.com to look up your medications. This will give you a list of where you can find your prescriptions at the most affordable prices. Or ask the pharmacist what the cash price is, or if they have any other discount programs available to help make your medication more affordable. This can be less expensive than what you would pay with insurance.

## 2019-01-09 NOTE — ED Triage Notes (Signed)
Pt presents with sore throat, left earache and feeling feverish at home x 3 days.

## 2019-01-10 LAB — NOVEL CORONAVIRUS, NAA (HOSP ORDER, SEND-OUT TO REF LAB; TAT 18-24 HRS): SARS-CoV-2, NAA: NOT DETECTED

## 2019-01-11 LAB — CULTURE, GROUP A STREP (THRC)

## 2019-02-19 ENCOUNTER — Encounter (HOSPITAL_COMMUNITY): Payer: Self-pay | Admitting: Family Medicine

## 2019-02-19 ENCOUNTER — Ambulatory Visit (HOSPITAL_COMMUNITY)
Admission: EM | Admit: 2019-02-19 | Discharge: 2019-02-19 | Disposition: A | Discharge status: Home / Self Care | Payer: Medicaid Other | Attending: Family Medicine | Admitting: Family Medicine

## 2019-02-19 ENCOUNTER — Other Ambulatory Visit: Payer: Self-pay

## 2019-02-19 DIAGNOSIS — Z23 Encounter for immunization: Secondary | ICD-10-CM | POA: Diagnosis not present

## 2019-02-19 DIAGNOSIS — T25221A Burn of second degree of right foot, initial encounter: Secondary | ICD-10-CM

## 2019-02-19 DIAGNOSIS — T300 Burn of unspecified body region, unspecified degree: Secondary | ICD-10-CM

## 2019-02-19 DIAGNOSIS — T24232A Burn of second degree of left lower leg, initial encounter: Secondary | ICD-10-CM

## 2019-02-19 MED ORDER — TETANUS-DIPHTH-ACELL PERTUSSIS 5-2.5-18.5 LF-MCG/0.5 IM SUSP
INTRAMUSCULAR | Status: AC
  Filled 2019-02-19: qty 0.5

## 2019-02-19 MED ORDER — TETANUS-DIPHTH-ACELL PERTUSSIS 5-2.5-18.5 LF-MCG/0.5 IM SUSP
0.5000 mL | Freq: Once | INTRAMUSCULAR | Status: AC
  Administered 2019-02-19: 0.5 mL via INTRAMUSCULAR

## 2019-02-19 MED ORDER — SILVER SULFADIAZINE 1 % EX CREA
TOPICAL_CREAM | CUTANEOUS | Status: AC
  Filled 2019-02-19: qty 85

## 2019-02-19 MED ORDER — HYDROCODONE-ACETAMINOPHEN 5-325 MG PO TABS: 1.0000 | ORAL_TABLET | Freq: Four times a day (QID) | ORAL | 0 refills | Status: AC | PRN

## 2019-02-19 MED ORDER — SILVER SULFADIAZINE 1 % EX CREA: 1.0000 "application " | TOPICAL_CREAM | Freq: Every day | CUTANEOUS | 0 refills | Status: AC
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
## 2019-02-19 NOTE — Discharge Instructions (Addendum)
Apply burn cream after shower daily.  Return if pain is not improving in 3 days.
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
